# Patient Record
Sex: Male | Born: 1970 | Race: White | Hispanic: No | Smoking: Former smoker
Health system: Southern US, Community
[De-identification: ages and names within clinical notes are randomized; demographics above are authoritative.]

## PROBLEM LIST (undated history)

## (undated) DIAGNOSIS — F32A Depression, unspecified: Secondary | ICD-10-CM

## (undated) DIAGNOSIS — K8020 Calculus of gallbladder without cholecystitis without obstruction: Secondary | ICD-10-CM

## (undated) DIAGNOSIS — D72819 Decreased white blood cell count, unspecified: Secondary | ICD-10-CM

## (undated) DIAGNOSIS — E785 Hyperlipidemia, unspecified: Secondary | ICD-10-CM

## (undated) DIAGNOSIS — R079 Chest pain, unspecified: Secondary | ICD-10-CM

## (undated) DIAGNOSIS — F909 Attention-deficit hyperactivity disorder, unspecified type: Secondary | ICD-10-CM

## (undated) DIAGNOSIS — F329 Major depressive disorder, single episode, unspecified: Secondary | ICD-10-CM

## (undated) DIAGNOSIS — U071 COVID-19: Secondary | ICD-10-CM

## (undated) DIAGNOSIS — E786 Lipoprotein deficiency: Secondary | ICD-10-CM

## (undated) DIAGNOSIS — M109 Gout, unspecified: Secondary | ICD-10-CM

## (undated) DIAGNOSIS — I10 Essential (primary) hypertension: Secondary | ICD-10-CM

## (undated) HISTORY — DX: Hyperlipidemia, unspecified: E78.5

## (undated) HISTORY — DX: Decreased white blood cell count, unspecified: D72.819

## (undated) HISTORY — DX: Lipoprotein deficiency: E78.6

## (undated) HISTORY — DX: Chest pain, unspecified: R07.9

## (undated) HISTORY — DX: Essential (primary) hypertension: I10

## (undated) HISTORY — DX: Gout, unspecified: M10.9

---
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
## 2003-11-14 ENCOUNTER — Encounter: Admission: RE | Admit: 2003-11-14 | Discharge: 2003-11-14 | Payer: Self-pay | Admitting: Occupational Medicine

## 2004-05-30 ENCOUNTER — Observation Stay (HOSPITAL_COMMUNITY): Admission: EM | Admit: 2004-05-30 | Discharge: 2004-05-31 | Payer: Self-pay | Admitting: Emergency Medicine

## 2008-08-03 ENCOUNTER — Emergency Department (HOSPITAL_COMMUNITY): Admission: EM | Admit: 2008-08-03 | Discharge: 2008-08-03 | Payer: Self-pay | Admitting: Emergency Medicine

## 2008-09-10 ENCOUNTER — Emergency Department (HOSPITAL_BASED_OUTPATIENT_CLINIC_OR_DEPARTMENT_OTHER): Admission: EM | Admit: 2008-09-10 | Discharge: 2008-09-11 | Payer: Self-pay | Admitting: Emergency Medicine

## 2008-09-10 ENCOUNTER — Ambulatory Visit: Payer: Self-pay | Admitting: Diagnostic Radiology

## 2008-09-19 ENCOUNTER — Encounter: Admission: RE | Admit: 2008-09-19 | Discharge: 2008-09-19 | Payer: Self-pay | Admitting: Family Medicine

## 2008-10-12 DIAGNOSIS — I10 Essential (primary) hypertension: Secondary | ICD-10-CM | POA: Insufficient documentation

## 2008-10-12 DIAGNOSIS — K219 Gastro-esophageal reflux disease without esophagitis: Secondary | ICD-10-CM | POA: Insufficient documentation

## 2008-10-12 DIAGNOSIS — F329 Major depressive disorder, single episode, unspecified: Secondary | ICD-10-CM

## 2008-10-12 DIAGNOSIS — F3289 Other specified depressive episodes: Secondary | ICD-10-CM | POA: Insufficient documentation

## 2008-10-13 ENCOUNTER — Ambulatory Visit: Payer: Self-pay | Admitting: Internal Medicine

## 2008-10-13 DIAGNOSIS — R509 Fever, unspecified: Secondary | ICD-10-CM

## 2008-10-13 DIAGNOSIS — R05 Cough: Secondary | ICD-10-CM

## 2008-10-13 DIAGNOSIS — J309 Allergic rhinitis, unspecified: Secondary | ICD-10-CM | POA: Insufficient documentation

## 2008-10-13 DIAGNOSIS — R079 Chest pain, unspecified: Secondary | ICD-10-CM

## 2008-10-21 ENCOUNTER — Encounter: Payer: Self-pay | Admitting: Internal Medicine

## 2008-10-25 ENCOUNTER — Ambulatory Visit: Payer: Self-pay | Admitting: Internal Medicine

## 2008-10-25 ENCOUNTER — Telehealth (INDEPENDENT_AMBULATORY_CARE_PROVIDER_SITE_OTHER): Payer: Self-pay | Admitting: *Deleted

## 2008-11-02 ENCOUNTER — Telehealth: Payer: Self-pay | Admitting: Internal Medicine

## 2008-11-07 ENCOUNTER — Telehealth: Payer: Self-pay | Admitting: Internal Medicine

## 2008-11-11 ENCOUNTER — Ambulatory Visit (HOSPITAL_COMMUNITY): Admission: RE | Admit: 2008-11-11 | Discharge: 2008-11-11 | Payer: Self-pay | Admitting: Internal Medicine

## 2008-11-11 ENCOUNTER — Encounter: Payer: Self-pay | Admitting: Internal Medicine

## 2008-11-14 ENCOUNTER — Inpatient Hospital Stay (HOSPITAL_BASED_OUTPATIENT_CLINIC_OR_DEPARTMENT_OTHER): Admission: RE | Admit: 2008-11-14 | Discharge: 2008-11-14 | Payer: Self-pay | Admitting: Interventional Cardiology

## 2008-11-14 ENCOUNTER — Encounter: Payer: Self-pay | Admitting: Internal Medicine

## 2008-11-14 HISTORY — PX: CARDIAC CATHETERIZATION: SHX172

## 2008-11-15 ENCOUNTER — Telehealth: Payer: Self-pay | Admitting: Internal Medicine

## 2008-11-18 ENCOUNTER — Telehealth: Payer: Self-pay | Admitting: Internal Medicine

## 2008-11-22 ENCOUNTER — Telehealth: Payer: Self-pay | Admitting: Internal Medicine

## 2008-11-22 ENCOUNTER — Ambulatory Visit: Admission: RE | Admit: 2008-11-22 | Discharge: 2008-11-22 | Payer: Self-pay | Admitting: Internal Medicine

## 2008-11-22 ENCOUNTER — Ambulatory Visit: Payer: Self-pay | Admitting: Internal Medicine

## 2008-11-22 DIAGNOSIS — J383 Other diseases of vocal cords: Secondary | ICD-10-CM

## 2008-11-22 HISTORY — PX: LARYNGOSCOPY: SHX5203

## 2008-11-23 ENCOUNTER — Telehealth: Payer: Self-pay | Admitting: Internal Medicine

## 2008-11-23 ENCOUNTER — Encounter: Payer: Self-pay | Admitting: Internal Medicine

## 2008-12-05 ENCOUNTER — Encounter: Admission: RE | Admit: 2008-12-05 | Discharge: 2008-12-05 | Payer: Self-pay | Admitting: Family Medicine

## 2008-12-07 ENCOUNTER — Ambulatory Visit: Payer: Self-pay | Admitting: Hematology & Oncology

## 2009-01-19 ENCOUNTER — Ambulatory Visit: Payer: Self-pay | Admitting: Hematology & Oncology

## 2009-01-23 LAB — CBC WITH DIFFERENTIAL (CANCER CENTER ONLY)
BASO%: 0.4 % (ref 0.0–2.0)
EOS%: 1.9 % (ref 0.0–7.0)
HGB: 12.9 g/dL — ABNORMAL LOW (ref 13.0–17.1)
LYMPH#: 1 10*3/uL (ref 0.9–3.3)
MCHC: 33.4 g/dL (ref 32.0–35.9)
NEUT#: 2.4 10*3/uL (ref 1.5–6.5)
Platelets: 114 10*3/uL — ABNORMAL LOW (ref 145–400)
RDW: 15.6 % — ABNORMAL HIGH (ref 10.5–14.6)

## 2009-01-25 LAB — HAPTOGLOBIN: Haptoglobin: 138 mg/dL (ref 16–200)

## 2009-01-25 LAB — RETICULOCYTES (CHCC)
ABS Retic: 68.8 10*3/uL (ref 19.0–186.0)
RBC.: 6.25 MIL/uL — ABNORMAL HIGH (ref 4.22–5.81)

## 2009-01-25 LAB — IRON AND TIBC
%SAT: 17 % — ABNORMAL LOW (ref 20–55)
TIBC: 328 ug/dL (ref 215–435)

## 2009-01-25 LAB — HEMOGLOBINOPATHY EVALUATION: Hgb A2 Quant: 5.2 % — ABNORMAL HIGH (ref 2.2–3.2)

## 2009-04-14 ENCOUNTER — Ambulatory Visit: Payer: Self-pay | Admitting: Hematology & Oncology

## 2009-10-29 ENCOUNTER — Emergency Department (HOSPITAL_COMMUNITY): Admission: EM | Admit: 2009-10-29 | Discharge: 2009-10-29 | Payer: Self-pay | Admitting: Family Medicine

## 2010-02-11 ENCOUNTER — Emergency Department (HOSPITAL_COMMUNITY)
Admission: EM | Admit: 2010-02-11 | Discharge: 2010-02-11 | Payer: Self-pay | Source: Home / Self Care | Admitting: Family Medicine

## 2010-02-11 ENCOUNTER — Emergency Department (HOSPITAL_COMMUNITY)
Admission: EM | Admit: 2010-02-11 | Discharge: 2010-02-11 | Payer: Self-pay | Source: Home / Self Care | Admitting: Emergency Medicine

## 2010-04-10 ENCOUNTER — Ambulatory Visit (HOSPITAL_COMMUNITY): Admit: 2010-04-10 | Payer: Self-pay | Admitting: Psychology

## 2010-06-05 LAB — CBC
MCH: 20.1 pg — ABNORMAL LOW (ref 26.0–34.0)
MCHC: 32.3 g/dL (ref 30.0–36.0)
MCV: 62.3 fL — ABNORMAL LOW (ref 78.0–100.0)
Platelets: 119 10*3/uL — ABNORMAL LOW (ref 150–400)
RBC: 6.95 MIL/uL — ABNORMAL HIGH (ref 4.22–5.81)

## 2010-06-05 LAB — DIFFERENTIAL
Eosinophils Absolute: 0 10*3/uL (ref 0.0–0.7)
Eosinophils Relative: 0 % (ref 0–5)
Lymphs Abs: 1.6 10*3/uL (ref 0.7–4.0)

## 2010-06-05 LAB — POCT I-STAT, CHEM 8
Creatinine, Ser: 1.1 mg/dL (ref 0.4–1.5)
Glucose, Bld: 104 mg/dL — ABNORMAL HIGH (ref 70–99)
Hemoglobin: 15.6 g/dL (ref 13.0–17.0)
Sodium: 138 mEq/L (ref 135–145)
TCO2: 29 mmol/L (ref 0–100)

## 2010-06-08 LAB — POCT I-STAT, CHEM 8
Calcium, Ion: 1.19 mmol/L (ref 1.12–1.32)
Glucose, Bld: 122 mg/dL — ABNORMAL HIGH (ref 70–99)
HCT: 45 % (ref 39.0–52.0)
Hemoglobin: 15.3 g/dL (ref 13.0–17.0)

## 2010-07-03 LAB — BASIC METABOLIC PANEL
BUN: 15 mg/dL (ref 6–23)
Creatinine, Ser: 0.88 mg/dL (ref 0.4–1.5)
GFR calc non Af Amer: >60 mL/min (ref >60)

## 2010-07-03 LAB — DIFFERENTIAL
Eosinophils Relative: 0 % (ref 0–5)
Monocytes Absolute: 0.4 10*3/uL (ref 0.1–1.0)
Monocytes Relative: 6 % (ref 3–12)
Neutrophils Relative %: 90 % — ABNORMAL HIGH (ref 43–77)

## 2010-07-03 LAB — CBC
Platelets: 99 10*3/uL — ABNORMAL LOW (ref 150–400)
WBC: 7.3 10*3/uL (ref 4.0–10.5)

## 2010-07-03 LAB — CULTURE, BLOOD (ROUTINE X 2): Culture: NO GROWTH

## 2010-08-07 NOTE — Op Note (Signed)
NAME:  Cody Medina, Cody Medina          ACCOUNT NO.:  [id number]   MEDICAL RECORD NO.:  [id number]          PATIENT TYPE:  AMB   LOCATION:  CARD                         FACILITY:  [facility]   PHYSICIAN:  Kalman Shan, MD   DATE OF BIRTH:  February 04, 1971   DATE OF PROCEDURE:  11/22/2008  DATE OF DISCHARGE:                               OPERATIVE REPORT   TYPE OF PROCEDURE:  Laryngoscopy.   INDICATIONS:  Rule out vocal cord dysfunction. Dyspnea and Vocal Cord  Diseases NOS   PREPROCEDURE ASSESSMENT:  Cody Medina is a 40 year old male who some  months ago had a dental abscess removed, and since then have a lot of  respiratory symptoms and chest pain that worsened with exertion,  extensive cardiac workup was negative.  He presented for a methacholine  challenge test, and during that time on spirometry was found to have  variable inferior loop flattening suggestive of vocal cord dysfunction.  Therefore, this procedure is being performed to rule out vocal cord  dysfunction, any anatomic abnormalities in his upper airway.   PREPROCEDURE PLAN:  The plan was to do a laryngoscopy using a flexible  bronchoscope inserted through his nose to get a visualization of the  epiglottis and the vocal cords.  The plan was to do a resting  laryngoscopy and also one after exercise with a treadmill on a ramp.  This is because of his symptoms that are predominantly brought on when  he climbs stairs.   Preprocedure vital signs were stable.  Exam was essentially normal.  The  patient was observed to walked into the procedure suite, but after  coming here he started getting anxious and started complaining of foot  pain.   During the preprocedure preparation to insert lidocaine jelly into his  nose, he got extremely short of breath and all his symptoms were  reduced, and he was hyperventilating with anxiety.  All the rest of the  vital signs were stable.   PROCEDURE NOTE:  Based on the above, it was decided to  do a laryngoscopy  just at rest.  A small bronchoscope was introduced into his right naris.  Good view of the epiglottis and the arytenoids in the vocal cords were  obtained.  The arytenoids appeared a little bit generous in size, but no  obvious ulcerations or redness were noted either on the false vocal  cords, the arytenoids or the true vocal cords.  The epiglottis also  looked normal.  No obvious mass was seen.  He patient became extremely  symptomatic at rest even with the bronchoscope being introduced through  his right naris.  There is obvious paradoxical vocal cord dysfunction  present.  With inspiration, his vocal cords were adducting and the chink  was observed.  This was observed for a few minutes, and when I told him  to relax, his vocal cord movements tended to normalize but not fully.  After the procedure was terminated, the bronchoscope was withdrawn.   No sedation was given.   IMPRESSION:  Positive vocal cord dysfunction test.  He has PARADOXICAL VOCAL CORD DYSFUNCTION   POSTPROCEDURE PLAN:  1. He  can leave the procedure suite.  No recovery time is needed.  2. Will refer him to speech therapy  for vocal cord dysfunction.  His      mom and he were updated in full about his condition.      Kalman Shan, MD  Electronically Signed     MR/MEDQ  D:  11/22/2008  T:  11/22/2008  Job:  [id number]

## 2010-08-07 NOTE — Cardiovascular Report (Signed)
NAME:  Cody Medina, Cody Medina NO.:  [id number]   MEDICAL RECORD NO.:  [id number]          PATIENT TYPE:  OIB   LOCATION:  1966                         FACILITY:  [facility]   PHYSICIAN:  Corky Crafts, MDDATE OF BIRTH:  Jul 16, 1970   DATE OF PROCEDURE:  11/14/2008  DATE OF DISCHARGE:  11/14/2008                            CARDIAC CATHETERIZATION   REFERRING PHYSICIAN:  Kalman Shan, MD and also Emeterio Reeve, MD   PROCEDURES PERFORMED:  Left heart catheterization, left ventriculogram,  coronary angiogram, abdominal aortogram.   OPERATOR:  Corky Crafts, MD   INDICATIONS:  Persistent chest pain.   PROCEDURE NOTE:  The risks and benefits of cardiac catheterization were  explained to the patient and informed consent was obtained.  He was  brought to the cath lab.  He was prepped and draped in the usual sterile  fashion.  His right groin was infiltrated with 1% lidocaine.  A 4-French  sheath was placed into the right femoral artery using the modified  Seldinger technique.  Left coronary artery angiography was performed  using a JL-4.0 catheter.  The catheter was advanced to the vessel ostium  under fluoroscopic guidance.  Digital angiography was performed in  multiple projections using hand injection of contrast.  Right coronary  artery angiography was then performed using a 3DRC catheter.  The  catheter was advanced to the vessel ostium under fluoroscopic guidance.  Digital angiography was performed in multiple projections using hand  injection of contrast.  A pigtail catheter was advanced to the ascending  aorta and across the aortic valve.  Under fluoroscopic guidance, power  injection of contrast was performed in the RAO projection to image the  left ventricle.  Catheter was pulled back under continuous hemodynamic  pressure monitoring.  Catheter was withdrawn to the abdominal aorta.  Power injection of contrast was performed in the AP projection to  image  the infrarenal abdominal aorta.   FINDINGS:  The left main coronary artery is widely patent.  The left circumflex is a medium-sized vessel.  The first and second  obtuse marginals were small vessels and widely patent.  Left anterior descending is a medium-sized vessel, which reaches the  apex.  The first diagonal is medium-sized and also widely patent.  The right coronary artery is a large dominant vessel.  The posterior  descending artery and posterolateral artery are widely patent.   HEMODYNAMICS:  LV pressure 110/7 with an LVEDP of 16 mmHg.  Aortic  pressure 110/77 with a mean aortic pressure of 94 mmHg.  Left ventriculogram showed normal ventricular function with an estimated  ejection fraction of 55%.  Abdominal aortogram, no abdominal aortic aneurysm.  Bilateral single  renal arteries both of which are widely patent.   IMPRESSION:  1. Angiographically normal coronary arteries.  2. Normal left ventricular function.  3. Normal hemodynamics.  4. No renal artery stenosis or abdominal aortic aneurysm.   RECOMMENDATIONS:  It appears that the patient's pain is likely  noncardiac in origin.  He will again follow up with his pulmonologist.  He has mentioned that he will be having some type of bronchoscopy study  done.  I will follow up with him in the office just to check his groin  later this week.  Continue aggressive preventive therapy.      Corky Crafts, MD  Electronically Signed     JSV/MEDQ  D:  11/16/2008  T:  11/17/2008  Job:  [id number]

## 2010-08-10 NOTE — Discharge Summary (Signed)
NAME:  Cody Medina, Cody Medina          ACCOUNT NO.:  [id number]   MEDICAL RECORD NO.:  [id number]          PATIENT TYPE:  OBV   LOCATION:  3704                         FACILITY:  [facility]   PHYSICIAN:  Corinna L. Lendell Caprice, MDDATE OF BIRTH:  05-19-1970   DATE OF ADMISSION:  05/30/2004  DATE OF DISCHARGE:                                 DISCHARGE SUMMARY   DIAGNOSIS:  Chest pain most likely secondary to gastroesophageal reflux  disease, Cardiolite negative.   DISCHARGE MEDICATION:  Protonix 40 mg a day.   Follow up with Dr. Manus Gunning in two to four weeks.   ACTIVITY:  Ad lib.   CONDITION:  Stable.   CONSULTATIONS:  Dr. Patty Sermons.   PROCEDURES:  None.   PERTINENT LABORATORY DATA:  D-dimer less than 0.22.  Hemoglobin/hematocrit,  BMET, liver function tests, lipase, serial cardiac enzymes all within normal  limits. Total cholesterol was 170, LDL was 95, HDL 37, triglycerides 189.   SPECIAL STUDIES AND RADIOLOGY:  Chest x-ray was negative.   EKG showed normal sinus rhythm.   Stress Cardiolite this showed no ischemia or infarct, ejection fraction 64%.   HISTORY AND HOSPITAL COURSE:  [name] is a 40 year old white male  patient of Dr. Manus Gunning who presented with chest pressure which radiated to  his left shoulder.  It was not associated with any exertion.  He did  somewhat relieve with belching.  He had had it periodically for several  days. Cardiology was consulted. The patient  was placed on observation to telemetry, ruled out for myocardial infarction  and had a negative Cardiolite.  I suspect this may be GI in origin,  specifically gastroesophageal reflux and/or esophageal spasm. The patient  had been started on aspirin empirically and had normal vital signs and  normal exam. No reproducibility to the pain.      CLS/MEDQ  D:  05/31/2004  T:  05/31/2004  Job:  [id number]   cc:   Elmore Guise., M.D.  [address]  [place], [place] [address]  Fax: [phone]   Bryan Lemma. Manus Gunning, M.D.  [address] [place]  [place] [address]  Fax: [phone]

## 2010-08-10 NOTE — H&P (Signed)
NAME:  Cody Medina, IDE NO.:  [id number]   MEDICAL RECORD NO.:  [id number]          PATIENT TYPE:  EMS   LOCATION:  MAJO                         FACILITY:  [facility]   PHYSICIAN:  Hollice Espy, M.D.DATE OF BIRTH:  [birth year]   DATE OF ADMISSION:  05/30/2004  DATE OF DISCHARGE:                                HISTORY & PHYSICAL   PHYSICIANS:  1.  Corinna L. Lendell Caprice, M.D., attending physician.  2.  Bryan Lemma. Manus Gunning, M.D., primary care physician.   CHIEF COMPLAINT:  Chest pressure.   HISTORY OF PRESENT ILLNESS:  The patient is a 40 year old white male with a  past medical history of hypertension who presents after an episode of heavy  chest pressure. The patient tells me he has been in his relatively well  state of health except over the last few weeks. He has noticed episodes of  chest versus arm pain/pressure where it will radiate down his arm or across  the left side of his chest, lasting generally for a few moments, usually  with exertion causing shortness of breath and usually while the patient is  doing something active. He did not make to much of this until today when he  had some episode all day of this continuous, severe, crushing chest pressure  involving the left side of his chest mostly, but also radiating to the right  side of his chest and down his arm. It caused significant shortness of  breath and the patient became quite concern and came into the emergency  room.   In the emergency room, he was noted to be saturating 100% on two liters.  Chest x-ray was done and found to be negative. Lab work for the most part  was negative and negative d-dimer. Normal LFTs, electrolytes, and a first  set of cardiac markers were negative. An EKG was performed which the  computer read as normal sinus rhythm; however, there is some very minimal  early ST appearing elevations on his inferior leads. The patient initially  prior to coming to the emergency room went  to his primary care physician,  Dr. Bryan Lemma. Ehinger's office, and there he had an EKG done as well. There  some Q-waves were noted in his inferior leads. The patient was brought to  the emergency room, given aspirin, nitroglycerin, and oxygen. He said that  the chest pressure relieved with the nitroglycerin and he is feeling now  back to completely normal.   REVIEW OF SYMPTOMS:  He has no other complaints. He currently denies any  headaches, visual changes, dysphagia, chest pain, palpitation, shortness of  breath, wheeze, cough, abdominal pain, constipation, diarrhea, hematuria,  dysuria, or focal extremity weakness or numbness. His review of systems is  otherwise negative.   PAST MEDICAL HISTORY:  Only for seasonal rhinitis and hypertension.   MEDICATIONS:  Takes an occasional Allegra. He does not take any medications  for his hypertension as he has been told that he is too young.   ALLERGIES:  He is allergic to MORPHINE.   SOCIAL HISTORY:  He smokes an occasional cigar and drinks one or two beers  maximum on a weekend. He denies any drug use.   FAMILY HISTORY:  Notable for a grandfather with a MI in his 49s.   PHYSICAL EXAMINATION:  VITAL SIGNS:  Temperature 98.2, heart rate 88,  respirations 28, blood pressure 116/78. Oxygen saturation 100% on two  liters.  GENERAL:  The patient appears alert and oriented x 3. He is large for his  age.  HEENT:  Normocephalic and atraumatic. His mucus membranes are moist. He has  a mildly narrow airway.  HEART:  Regular rate and rhythm, S1 and S2.  LUNGS:  Clear to auscultation bilaterally.  ABDOMEN:  Soft, nontender, and nondistended. Positive bowel sounds.  EXTREMITIES:  No clubbing, cyanosis, or edema.   LABORATORY DATA:  Chest x-ray is normal. D-dimer is less than 0.22.  Creatinine of 1. LFTs normal. Lipase 22, sodium 138, potassium 3.8, chloride  106, bicarbonate 29, BUN 16, glucose 116. A pH of 7.54, pCO2 29. H&H 16.3  and 48. CPK  56.6, MB 2.8, troponin I less than 0.05.   ASSESSMENT/PLAN:  1.  Chest pain:  Although the patient is young at 33, he does have some risk      factors with his size, history of hypertension. He has never had his      cholesterol checked and his electrocardiogram shows some questionable      findings in the inferior leads. We will admit, rule out for a myocardial      infarction, check two more sets of serial enzymes, recheck      electrocardiogram in the morning. Check fasting lipid profile in the      morning and then get a stress test. We will ask [facility] for      assistance.  2.  Hypertension:  The patient's blood pressure here appears to be okay,      although reports that he has a diastolic of at least 100 at home and      this may need medication therapy if it continues to be a problem.  3.  Allergic rhinitis:  Stable.  4.  Occasional tobacco use.      SKK/MEDQ  D:  05/30/2004  T:  05/30/2004  Job:  [id number]   cc:   Bryan Lemma. Manus Gunning, M.D.  [address] [place]  [place] [address]  Fax: [phone]   [facility]

## 2010-08-28 ENCOUNTER — Other Ambulatory Visit: Payer: Self-pay | Admitting: Family Medicine

## 2010-08-28 ENCOUNTER — Ambulatory Visit
Admission: RE | Admit: 2010-08-28 | Discharge: 2010-08-28 | Disposition: A | Discharge status: Home / Self Care | Payer: BC Managed Care – PPO | Source: Ambulatory Visit | Attending: Family Medicine | Admitting: Family Medicine

## 2010-08-28 DIAGNOSIS — M25569 Pain in unspecified knee: Secondary | ICD-10-CM

## 2010-09-10 ENCOUNTER — Ambulatory Visit: Payer: BC Managed Care – PPO | Attending: Family Medicine

## 2010-09-10 DIAGNOSIS — R269 Unspecified abnormalities of gait and mobility: Secondary | ICD-10-CM | POA: Insufficient documentation

## 2010-09-10 DIAGNOSIS — R262 Difficulty in walking, not elsewhere classified: Secondary | ICD-10-CM | POA: Insufficient documentation

## 2010-09-10 DIAGNOSIS — M25569 Pain in unspecified knee: Secondary | ICD-10-CM | POA: Insufficient documentation

## 2010-09-10 DIAGNOSIS — M25669 Stiffness of unspecified knee, not elsewhere classified: Secondary | ICD-10-CM | POA: Insufficient documentation

## 2010-09-10 DIAGNOSIS — IMO0001 Reserved for inherently not codable concepts without codable children: Secondary | ICD-10-CM | POA: Insufficient documentation

## 2010-09-11 ENCOUNTER — Ambulatory Visit: Payer: BC Managed Care – PPO

## 2010-10-01 ENCOUNTER — Encounter: Payer: BC Managed Care – PPO | Admitting: Physical Therapy

## 2012-03-04 ENCOUNTER — Telehealth: Payer: Self-pay | Admitting: Hematology & Oncology

## 2012-03-04 NOTE — Telephone Encounter (Signed)
Left pt message to call for appointments °

## 2012-03-05 ENCOUNTER — Telehealth: Payer: Self-pay | Admitting: Hematology & Oncology

## 2012-03-05 NOTE — Telephone Encounter (Signed)
Left message on both numbers for pt to call and schedule appointment

## 2012-03-06 ENCOUNTER — Telehealth: Payer: Self-pay | Admitting: Hematology & Oncology

## 2012-03-06 NOTE — Telephone Encounter (Signed)
PT AWARE OF 03-11-12 APPOINTMENT

## 2012-03-11 ENCOUNTER — Ambulatory Visit: Payer: BC Managed Care – PPO

## 2012-03-11 ENCOUNTER — Other Ambulatory Visit (HOSPITAL_BASED_OUTPATIENT_CLINIC_OR_DEPARTMENT_OTHER): Payer: BC Managed Care – PPO | Admitting: Lab

## 2012-03-11 ENCOUNTER — Ambulatory Visit (HOSPITAL_BASED_OUTPATIENT_CLINIC_OR_DEPARTMENT_OTHER): Payer: BC Managed Care – PPO | Admitting: Hematology & Oncology

## 2012-03-11 DIAGNOSIS — D696 Thrombocytopenia, unspecified: Secondary | ICD-10-CM

## 2012-03-11 DIAGNOSIS — R634 Abnormal weight loss: Secondary | ICD-10-CM

## 2012-03-11 DIAGNOSIS — R161 Splenomegaly, not elsewhere classified: Secondary | ICD-10-CM

## 2012-03-11 DIAGNOSIS — D649 Anemia, unspecified: Secondary | ICD-10-CM

## 2012-03-11 LAB — CBC WITH DIFFERENTIAL (CANCER CENTER ONLY)
BASO#: 0 10*3/uL (ref 0.0–0.2)
Eosinophils Absolute: 0.1 10*3/uL (ref 0.0–0.5)
HCT: 41.6 % (ref 38.7–49.9)
HGB: 13.6 g/dL (ref 13.0–17.1)
LYMPH%: 20.8 % (ref 14.0–48.0)
MCH: 20 pg — ABNORMAL LOW (ref 28.0–33.4)
MCV: 61 fL — ABNORMAL LOW (ref 82–98)
MONO#: 0.4 10*3/uL (ref 0.1–0.9)
MONO%: 10 % (ref 0.0–13.0)
RBC: 6.81 10*6/uL — ABNORMAL HIGH (ref 4.20–5.70)
WBC: 4.3 10*3/uL (ref 4.0–10.0)

## 2012-03-11 NOTE — Progress Notes (Signed)
This office note has been dictated.

## 2012-03-12 NOTE — Progress Notes (Signed)
CC:   Cody Reeve, MD  DIAGNOSES: 1. Thrombocytopenia-mild. 2. Unintentional weight loss.  HISTORY OF PRESENT ILLNESS:  Cody Medina is a very nice 41 year old white gentleman.  He is originally from [place].  He went into the [employer] after high school.  He was out in [place].  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  He, I think, works as a Clinical biochemist.  He is followed by Dr. Paulino Rily.  He has noted weight loss that is unintentional.  He has lost about 40 or 45 pounds over the past few months.  He has not had any history of fevers or sweats.  He has had no change in bowel or bladder habits.  There has been no bleeding.  His appetite is okay.  He has not noticed any palpable lymph glands.  Dr. Paulino Rily has done lab work on him.  She found that he did have some mild thrombocytopenia.  Lab work that was done back in early November showed platelet count of 119.  He was not anemic with a hemoglobin of 13.1.  White cell count was 42.  He does have a very low MCV.  He does state that his mother had "Mediterranean anemia."  In early December, a CBC was repeated.  This showed a white cell count of 4.6, hemoglobin 13.5, hematocrit 41.8, platelet count 125.  MCV was 62.  She did a TSH.  This was normal.  She did do a Hemoccult.  This apparently was positive.  It was felt that this may represent hemorrhoids.  His metabolic panel showed normal liver function studies.  His sodium was normal.  BUN and creatinine were also okay.  He had a PSA done which was 0.55.  Again, TSH was normal at 0.66.  He was found to have some external and thrombosed hemorrhoids.  His stool was brown.  Again, stool was heme positive.  There is a family history of, I think, colon cancer in a great aunt. Outside of that, no other family history of cancer is noted.  He has had no cough.  He has had no rashes.  He has had no dysphagia or odynophagia.  There is no double vision or  blurred vision.  He has had no headache.  Dr. Paulino Rily kindly referred Cody Medina to the [facility] for an evaluation.  Overall, his performance status is ECOG 0.  He has no obvious risk factors for hepatitis or HIV.  He currently is going through a separation with his wife.  PAST MEDICAL HISTORY: 1. Remarkable for adult onset ADHD. 2. Gout. 3. Thalassemia minor.  ALLERGIES: 1. Morphine. 2. Zantac. 3. Lamictal. 4.  MEDICATIONS:  Adderall 10 mg p.o. b.i.d., Klonopin 0.5 mg p.o. b.i.d., Lexapro I think 20 mg p.o. daily, Colcrys 0.6 mg p.o. daily p.r.n.  SOCIAL HISTORY:  Negative for any current tobacco use.  He did smoke in the past.  He has no significant alcohol use.  There is no recreational drug use.  He has no occupational exposures.  FAMILY HISTORY:  As stated previously.  REVIEW OF SYSTEMS:  As stated in the history of present illness.  No additional findings are noted on a 12-system review.  PHYSICAL EXAMINATION:  General:  This is a well-developed, well- nourished white gentleman in no obvious distress.  Vital signs: Temperature of 98, pulse is 72, respiratory rate 16, blood pressure 136/96.  Weight is 220.  Head and neck:  Normocephalic, atraumatic skull.  There are no ocular or oral lesions.  There are no palpable cervical or supraclavicular lymph nodes.  Lungs:  Clear to percussion and auscultation bilaterally.  Cardiac:  Regular rate and rhythm with a normal S1 and S2.  There are no murmurs, rubs, or bruits.  Axillary:  No bilateral axillary adenopathy.  Abdomen:  Soft.  He has good bowel sounds.  There is no fluid wave.  There is no palpable hepatomegaly. His spleen tip is palpable with deep inspiration under the left costal margin.  Back:  No tenderness over the spine, ribs, or hips. Extremities:  No clubbing, cyanosis, or edema.  Neurological:  No focal neurological deficits.  Skin:  No rashes, ecchymoses, or  petechiae.  LABORATORY STUDIES:  White cell count is 4.3, hemoglobin 13.6, hematocrit 41.6, and platelet count is 124.  MCV is 61.  Peripheral smear shows microcytic red cells.  He has a few target cells. No schistocytes are noted.  There are no nucleated red blood cells.  I see no rouleaux formation.  There are no spherocytes.  White cells appear normal in morphology and maturation.  No hypersegmented polys are noted.  There are no immature myeloid cells.  There are no atypical lymphocytes.  Platelets are mildly decreased in number.  He has numerous large platelets.  Platelets are well granulated.  IMPRESSION:  Cody Medina is a really nice 41 year old gentleman with weight loss.  He has some thrombocytopenia which is mild.  There is splenomegaly.  I suppose this may be related to his thalassemia.  I am pretty sure he probably does have some thalassemia, given his low mean cell volume and not being anemic.  I did note that he does have some heme-positive stools.  These are felt to be reflective of hemorrhoids, however.  I believe that Cody Medina clearly needs to have a CT scan done.  I think we do need to check his chest, abdomen, and pelvis.  The fact that he has the splenomegaly might indicate an underlying hematologic issue.  Again, it is hard to say what the cause of this weight loss is. Splenomegaly certainly would be the source of his mild thrombocytopenia. The thrombocytopenia is asymptomatic.  I do not believe that the thrombocytopenia is related to his weight loss.  We will get Cody Medina set up with a CT scan within the week.  I think that further therapeutic interventions will be dictated by the results of his CT scan.  I am sending off an HIV titer on him.  Again, he has no obvious risk factors, but I still think that this would be obligatory.  I am sending off a hemoglobin electrophoresis on him also.  Hopefully, we will not find an underlying  malignancy.  I do think that if his scans are negative, that he probably still would need to have a colonoscopy.  I am checking iron studies on him.  I think if the iron studies do come back consistent with iron deficiency, despite him not being anemic, then this would obligate US to get him to a colonoscopy.  I spent a good our or more with Cody Medina.  He is a real nice guy. He served in [employer] for a year or so before knee problems got him medically discharged.  Again, we can probably care of a lot of this over the phone right now.    ______________________________ Josph Macho, M.D. PRE/MEDQ  D:  03/11/2012  T:  03/12/2012  Job:  [id number]

## 2012-03-13 LAB — COMPREHENSIVE METABOLIC PANEL
ALT: 24 U/L (ref 0–53)
AST: 13 U/L (ref 0–37)
Creatinine, Ser: 0.86 mg/dL (ref 0.50–1.35)
Total Bilirubin: 0.7 mg/dL (ref 0.3–1.2)

## 2012-03-13 LAB — HEMOGLOBINOPATHY EVALUATION: Hemoglobin Other: 0 %

## 2012-03-13 LAB — IRON AND TIBC
%SAT: 19 % — ABNORMAL LOW (ref 20–55)
TIBC: 339 ug/dL (ref 215–435)

## 2012-03-13 LAB — RETICULOCYTES (CHCC)
ABS Retic: 97 10*3/uL (ref 19.0–186.0)
RBC.: 6.93 MIL/uL — ABNORMAL HIGH (ref 4.22–5.81)

## 2012-03-13 LAB — HIV ANTIBODY (ROUTINE TESTING W REFLEX): HIV: NONREACTIVE

## 2012-03-19 ENCOUNTER — Ambulatory Visit (HOSPITAL_BASED_OUTPATIENT_CLINIC_OR_DEPARTMENT_OTHER)
Admission: RE | Admit: 2012-03-19 | Discharge: 2012-03-19 | Disposition: A | Discharge status: Home / Self Care | Payer: BC Managed Care – PPO | Source: Ambulatory Visit | Attending: Hematology & Oncology | Admitting: Hematology & Oncology

## 2012-03-19 ENCOUNTER — Telehealth: Payer: Self-pay | Admitting: *Deleted

## 2012-03-19 DIAGNOSIS — R634 Abnormal weight loss: Secondary | ICD-10-CM

## 2012-03-19 DIAGNOSIS — D649 Anemia, unspecified: Secondary | ICD-10-CM

## 2012-03-19 DIAGNOSIS — R161 Splenomegaly, not elsewhere classified: Secondary | ICD-10-CM

## 2012-03-19 DIAGNOSIS — D61818 Other pancytopenia: Secondary | ICD-10-CM | POA: Insufficient documentation

## 2012-03-19 DIAGNOSIS — E041 Nontoxic single thyroid nodule: Secondary | ICD-10-CM | POA: Insufficient documentation

## 2012-03-19 MED ORDER — IOHEXOL 300 MG/ML  SOLN
100.0000 mL | Freq: Once | INTRAMUSCULAR | Status: AC | PRN
  Administered 2012-03-19: 100 mL via INTRAVENOUS

## 2012-03-19 NOTE — Telephone Encounter (Addendum)
Message copied by Mirian Capuchin on Thu Mar 19, 2012  3:40 PM ------      Message from: Arlan Organ R      Created: Thu Mar 19, 2012 11:16 AM       I called and told him that the CT scan showed mild splenomegaly, but nothing else.  Labs were c/w Thal trait.  I told him to take some folic acid - OTC.  I do not need to see him in the future unless there is a problem.  No need for a BM Bx.  Platelets will always be on the low side, but should not be low enough to cause issues.  Pete E Dr. Myna Hidalgo called pt himself.

## 2012-03-25 DIAGNOSIS — K8020 Calculus of gallbladder without cholecystitis without obstruction: Secondary | ICD-10-CM

## 2012-03-25 HISTORY — DX: Calculus of gallbladder without cholecystitis without obstruction: K80.20

## 2012-03-28 ENCOUNTER — Emergency Department (HOSPITAL_COMMUNITY)
Admission: EM | Admit: 2012-03-28 | Discharge: 2012-03-28 | Disposition: A | Discharge status: Home / Self Care | Payer: BC Managed Care – PPO | Attending: Emergency Medicine | Admitting: Emergency Medicine

## 2012-03-28 ENCOUNTER — Emergency Department (HOSPITAL_COMMUNITY): Payer: BC Managed Care – PPO

## 2012-03-28 ENCOUNTER — Encounter (HOSPITAL_COMMUNITY): Payer: Self-pay

## 2012-03-28 DIAGNOSIS — R1110 Vomiting, unspecified: Secondary | ICD-10-CM | POA: Insufficient documentation

## 2012-03-28 DIAGNOSIS — Z79899 Other long term (current) drug therapy: Secondary | ICD-10-CM | POA: Insufficient documentation

## 2012-03-28 DIAGNOSIS — F3289 Other specified depressive episodes: Secondary | ICD-10-CM | POA: Insufficient documentation

## 2012-03-28 DIAGNOSIS — F329 Major depressive disorder, single episode, unspecified: Secondary | ICD-10-CM | POA: Insufficient documentation

## 2012-03-28 DIAGNOSIS — K8020 Calculus of gallbladder without cholecystitis without obstruction: Secondary | ICD-10-CM | POA: Insufficient documentation

## 2012-03-28 HISTORY — DX: Major depressive disorder, single episode, unspecified: F32.9

## 2012-03-28 HISTORY — DX: Depression, unspecified: F32.A

## 2012-03-28 LAB — COMPREHENSIVE METABOLIC PANEL
AST: 15 U/L (ref 0–37)
Albumin: 4.4 g/dL (ref 3.5–5.2)
Alkaline Phosphatase: 73 U/L (ref 39–117)
BUN: 17 mg/dL (ref 6–23)
Chloride: 103 mEq/L (ref 96–112)
Potassium: 3.9 mEq/L (ref 3.5–5.1)
Total Bilirubin: 0.5 mg/dL (ref 0.3–1.2)

## 2012-03-28 LAB — CBC
HCT: 41.8 % (ref 39.0–52.0)
RDW: 14.8 % (ref 11.5–15.5)
WBC: 9.9 10*3/uL (ref 4.0–10.5)

## 2012-03-28 LAB — URINALYSIS, ROUTINE W REFLEX MICROSCOPIC
Bilirubin Urine: NEGATIVE
Glucose, UA: NEGATIVE mg/dL
Hgb urine dipstick: NEGATIVE
Ketones, ur: 15 mg/dL — AB
Protein, ur: NEGATIVE mg/dL

## 2012-03-28 MED ORDER — ONDANSETRON HCL 4 MG/2ML IJ SOLN
4.0000 mg | Freq: Once | INTRAMUSCULAR | Status: AC
  Administered 2012-03-28: 4 mg via INTRAVENOUS
  Filled 2012-03-28: qty 2

## 2012-03-28 MED ORDER — ONDANSETRON HCL 4 MG PO TABS: 4.0000 mg | ORAL_TABLET | Freq: Four times a day (QID) | ORAL | Status: DC

## 2012-03-28 MED ORDER — HYDROMORPHONE HCL PF 1 MG/ML IJ SOLN
1.0000 mg | INTRAMUSCULAR | Status: DC | PRN
  Administered 2012-03-28: 1 mg via INTRAVENOUS
  Filled 2012-03-28: qty 1

## 2012-03-28 MED ORDER — OXYCODONE-ACETAMINOPHEN 5-325 MG PO TABS: 2.0000 | ORAL_TABLET | ORAL | Status: DC | PRN

## 2012-03-28 NOTE — ED Notes (Signed)
Pt A.O. X 4. Complains of  Pain "under right rib cage that wraps around to right side." Tender in RUQ and right flank. 10/10 x 4 hours. Denies  Injury to abd. Denies any previous episode. Denies any significant medical hxt. Reports hxt of depression.  Pt constantly changing positions. Yelling and screaming "help me! Please! Give me something! Give me anything!". NSR. Vitals stable.

## 2012-03-28 NOTE — ED Notes (Signed)
PT returned from ultrasound

## 2012-03-28 NOTE — ED Notes (Signed)
Family updated on plan of care: aware that [place] results are pending and that EDP will come and discuss findings with patient. Pt resting comfortably. Vitals stable.  No further needs at this time.

## 2012-03-28 NOTE — ED Notes (Signed)
RUQ pain. N/V. 18G R AC. Given 4 Zofran by EMS. Allergic to Morphine. No medications transported with patient.

## 2012-03-28 NOTE — ED Notes (Signed)
Pt A.O.x  4. Vitals stable. Discharge instructions reviewed with patient and mother. Verbalized understanding of medication and need to follow up. No further questions at this time. Ambulatory with minimal assistance.

## 2012-03-28 NOTE — ED Provider Notes (Signed)
History     CSN: [id number]  Arrival date & time 03/28/12  0139   First MD Initiated Contact with Patient 03/28/12 0205      No chief complaint on file.   (Consider location/radiation/quality/duration/timing/severity/associated sxs/prior treatment) HPI 42 year old male presents to the emergency department with complaint of upper abdominal pain with vomiting. He reports pain started diffusely across his upper abdomen around 9 PM. Patient then had multiple episodes of vomiting. He now reports pain is worse over his right upper quadrant. He denies previous history of same. He does have a history of GERD. He does not take any medicines for it. Patient is agitated, writhing in the bed, and no further history is able to be obtained.  Past Medical History  Diagnosis Date  . Depression     History reviewed. No pertinent past surgical history.  History reviewed. No pertinent family history.  History  Substance Use Topics  . Smoking status: Not on file  . Smokeless tobacco: Not on file  . Alcohol Use:       Review of Systems  Unable to perform ROS pt unable to cooperate  Allergies  Morphine sulfate  Home Medications   Current Outpatient Rx  Name  Route  Sig  Dispense  Refill  . AMPHETAMINE-DEXTROAMPHETAMINE 10 MG PO TABS   Oral   Take 10 mg by mouth Daily.         Marland Kitchen CLONAZEPAM 1 MG PO TABS   Oral   Take 1 mg by mouth Daily.         Marland Kitchen ESCITALOPRAM OXALATE 20 MG PO TABS   Oral   Take 20 mg by mouth Daily.           BP 133/84  Pulse 78  Resp 20  SpO2 100%  Physical Exam  Nursing note and vitals reviewed. Constitutional: He is oriented to person, place, and time. He appears well-developed and well-nourished.  HENT:  Head: Normocephalic and atraumatic.  Nose: Nose normal.  Mouth/Throat: Oropharynx is clear and moist.  Eyes: Conjunctivae normal and EOM are normal. Pupils are equal, round, and reactive to light.  Neck: Normal range of motion. Neck supple.  No JVD present. No tracheal deviation present. No thyromegaly present.  Cardiovascular: Normal rate, regular rhythm, normal heart sounds and intact distal pulses.  Exam reveals no gallop and no friction rub.   No murmur heard. Pulmonary/Chest: Effort normal and breath sounds normal. No stridor. No respiratory distress. He has no wheezes. He has no rales. He exhibits no tenderness.  Abdominal: Soft. Bowel sounds are normal. He exhibits no distension and no mass. There is tenderness (Significant tenderness over right upper quadrant with positive Murphy sign). There is no rebound and no guarding.  Musculoskeletal: Normal range of motion. He exhibits no edema and no tenderness.  Lymphadenopathy:    He has no cervical adenopathy.  Neurological: He is alert and oriented to person, place, and time. He exhibits normal muscle tone. Coordination normal.  Skin: Skin is warm and dry. No rash noted. No erythema. No pallor.  Psychiatric: He has a normal mood and affect. His behavior is normal. Judgment and thought content normal.    ED Course  Procedures (including critical care time)  Labs Reviewed  CBC - Abnormal; Notable for the following:    RBC 6.74 (*)     MCV 62.0 (*)     MCH 19.6 (*)     Platelets 140 (*)     All other components within normal  limits  COMPREHENSIVE METABOLIC PANEL - Abnormal; Notable for the following:    Glucose, Bld 156 (*)     All other components within normal limits  URINALYSIS, ROUTINE W REFLEX MICROSCOPIC - Abnormal; Notable for the following:    APPearance HAZY (*)     Specific Gravity, Urine 1.031 (*)     Ketones, ur 15 (*)     All other components within normal limits  LIPASE, BLOOD   US Abdomen Complete  03/28/2012  *RADIOLOGY REPORT*  Clinical Data:  Severe upper abdominal pain mostly right upper quadrant, nausea, vomiting, past history of splenectomy  ULTRASOUND ABDOMEN:  Technique:  Sonography of upper abdominal structures was performed.  Comparison:  CT  abdomen 03/19/2012  Gallbladder:  Normally distended, containing sludge as well as a 2.4 cm diameter gallstone at the gallbladder neck.  Minimal gallbladder wall thickening.  No pericholecystic fluid.  Unable to accurately assess for presence of a sonographic Murphy's sign due to prior administration of pain medication.  Common bile duct:  3 mm diameter  Liver:  Normal appearance  IVC:  Normal appearance  Pancreas:  Body and proximal tail normal appearance, remainder obscured by bowel gas  Spleen:  14.2 cm length the calculated volume of 397 ml.  Small adjacent splenule.  Right kidney:  10.5 cm length. Normal morphology without mass or hydronephrosis.  Left kidney:  12.2 cm length. Normal morphology without mass or hydronephrosis.  Aorta:  Normal caliber  Other:  No free fluid  IMPRESSION: Sludge in gallbladder with a 2.4 cm diameter calcified gallstone at gallbladder neck. Minimal gallbladder wall thickening. Unable to accurately assess for presence of a sonographic Murphy's sign but cannot exclude acute cholecystitis. Incomplete pancreatic visualization. Mild splenomegaly.   Original Report Authenticated By: Ulyses Southward, M.D.      1. Cholelithiasis       MDM  42 year old male with acute upper abdominal pain. Differential includes cholelithiasis, cholecystitis, gastroenteritis, peptic ulcer disease, pancreatitis. We'll get labs and ultrasound of abdomen. Will treat with fluids and Zofran, Dilaudid.        Olivia Mackie, MD 03/28/12 [id number]

## 2012-03-31 ENCOUNTER — Ambulatory Visit (INDEPENDENT_AMBULATORY_CARE_PROVIDER_SITE_OTHER): Payer: BC Managed Care – PPO | Admitting: Surgery

## 2012-03-31 ENCOUNTER — Encounter (INDEPENDENT_AMBULATORY_CARE_PROVIDER_SITE_OTHER): Payer: Self-pay | Admitting: Surgery

## 2012-03-31 VITALS — BP 152/74 | HR 92 | Temp 98.3°F | Resp 16 | Ht 69.0 in | Wt 202.4 lb

## 2012-03-31 DIAGNOSIS — K8020 Calculus of gallbladder without cholecystitis without obstruction: Secondary | ICD-10-CM

## 2012-03-31 NOTE — Progress Notes (Signed)
Patient ID: Cody Medina, male   DOB: [birth year], 42 y.o.   MRN: [id number]  Chief Complaint  Patient presents with  . New Evaluation    eval GB w/ stones    HPI Cody Medina is a 42 y.o. male.   HPI This is a pleasant gentleman referred by the emergency department after an attack of symptomatic cholelithiasis. He presented there with right upper quadrant abdominal pain and nausea and vomiting.  He is now almost pain-free. That was his first attack ever and was quite severe. The pain referred around to the back. He denies fevers. Bowel movements are normal. Past Medical History  Diagnosis Date  . Depression     Past Surgical History  Procedure Date  . Mandible reconstruction     Family History  Problem Relation Age of Onset  . Cancer Maternal Aunt     lung  . Cancer Maternal Grandmother     lung & non hodgkins  . Cancer Maternal Grandfather     non hodgkins    Social History History  Substance Use Topics  . Smoking status: Former Smoker    Quit date: 03/25/1993  . Smokeless tobacco: Never Used  . Alcohol Use: No    Allergies  Allergen Reactions  . Morphine Sulfate     REACTION: vomiting    Current Outpatient Prescriptions  Medication Sig Dispense Refill  . amphetamine-dextroamphetamine (ADDERALL) 10 MG tablet Take 10 mg by mouth Daily.      . clonazePAM (KLONOPIN) 1 MG tablet Take 1 mg by mouth Daily.      Marland Kitchen escitalopram (LEXAPRO) 20 MG tablet Take 20 mg by mouth Daily.      Marland Kitchen HYDROmorphone (DILAUDID) 4 MG tablet Take 4 mg by mouth every 4 (four) hours as needed.      . ondansetron (ZOFRAN) 4 MG tablet Take 1 tablet (4 mg total) by mouth every 6 (six) hours.  12 tablet  0  . oxyCODONE-acetaminophen (PERCOCET/ROXICET) 5-325 MG per tablet Take 2 tablets by mouth every 4 (four) hours as needed for pain.  20 tablet  0    Review of Systems Review of Systems  Constitutional: Negative for fever, chills and unexpected weight change.  HENT: Negative  for hearing loss, congestion, sore throat, trouble swallowing and voice change.   Eyes: Negative for visual disturbance.  Respiratory: Negative for cough and wheezing.   Cardiovascular: Negative for chest pain, palpitations and leg swelling.  Gastrointestinal: Positive for nausea, vomiting and abdominal pain. Negative for diarrhea, constipation, blood in stool, abdominal distention, anal bleeding and rectal pain.  Genitourinary: Negative for hematuria and difficulty urinating.  Musculoskeletal: Negative for arthralgias.  Skin: Negative for rash and wound.  Neurological: Negative for seizures, syncope, weakness and headaches.  Hematological: Negative for adenopathy. Does not bruise/bleed easily.  Psychiatric/Behavioral: Negative for confusion.    Blood pressure 152/74, pulse 92, temperature 98.3 F (36.8 C), temperature source Temporal, resp. rate 16, height 5\' 9"  (1.753 m), weight 202 lb 6.4 oz (91.808 kg).  Physical Exam Physical Exam  Constitutional: He is oriented to person, place, and time. He appears well-developed and well-nourished. No distress.  HENT:  Head: Normocephalic and atraumatic.  Right Ear: External ear normal.  Left Ear: External ear normal.  Nose: Nose normal.  Mouth/Throat: Oropharynx is clear and moist. No oropharyngeal exudate.  Eyes: Conjunctivae normal are normal. Pupils are equal, round, and reactive to light. Right eye exhibits no discharge. Left eye exhibits no discharge. No scleral icterus.  Neck: Normal range of motion. Neck supple. No tracheal deviation present. No thyromegaly present.  Cardiovascular: Normal rate, regular rhythm, normal heart sounds and intact distal pulses.   No murmur heard. Pulmonary/Chest: Effort normal and breath sounds normal. No respiratory distress. He has no wheezes. He has no rales.  Abdominal: Soft. Bowel sounds are normal. He exhibits no distension. There is tenderness. There is no rebound.  Musculoskeletal: Normal range of  motion. He exhibits no edema and no tenderness.  Lymphadenopathy:    He has no cervical adenopathy.  Neurological: He is alert and oriented to person, place, and time.  Skin: Skin is warm and dry. No rash noted. He is not diaphoretic. No erythema.  Psychiatric: His behavior is normal. Judgment normal.    Data Reviewed I have reviewed his ultrasound showing cholelithiasis with a gallstone in the gallbladder neck.  White blood count and liver function tests are normal  Assessment    Systematic cholelithiasis    Plan    Laparoscopic cholecystectomy with possible cholangiogram is recommended. I gave him literature regarding this. I discussed the surgical procedure in detail. I discussed the risks of surgery which includes but is not limited to bleeding, infection, injury, bile leak, injury to other structures, the need to convert to an open procedure, et Karie Soda. He understands and wishes to proceed. Surgery will be scheduled. Likelihood of success is good.       Tex Conroy A 03/31/2012, 10:21 AM

## 2012-04-09 ENCOUNTER — Encounter (HOSPITAL_BASED_OUTPATIENT_CLINIC_OR_DEPARTMENT_OTHER): Payer: Self-pay | Admitting: *Deleted

## 2012-04-10 ENCOUNTER — Encounter (HOSPITAL_BASED_OUTPATIENT_CLINIC_OR_DEPARTMENT_OTHER): Payer: Self-pay | Admitting: *Deleted

## 2012-04-14 NOTE — H&P (Signed)
Patient ID: Cody Medina, male DOB: 12-25-1970, 42 y.o. MRN: [id number]  Chief Complaint   Patient presents with   .  New Evaluation     eval GB w/ stones    HPI  Cody Medina is a 42 y.o. male.  HPI  This is a pleasant gentleman referred by the emergency department after an attack of symptomatic cholelithiasis. He presented there with right upper quadrant abdominal pain and nausea and vomiting. He is now almost pain-free. That was his first attack ever and was quite severe. The pain referred around to the back. He denies fevers. Bowel movements are normal.  Past Medical History   Diagnosis  Date   .  Depression     Past Surgical History   Procedure  Date   .  Mandible reconstruction     Family History   Problem  Relation  Age of Onset   .  Cancer  Maternal Aunt       lung    .  Cancer  Maternal Grandmother       lung & non hodgkins    .  Cancer  Maternal Grandfather       non hodgkins    Social History  History   Substance Use Topics   .  Smoking status:  Former Smoker     Quit date:  03/25/1993   .  Smokeless tobacco:  Never Used   .  Alcohol Use:  No    Allergies   Allergen  Reactions   .  Morphine Sulfate      REACTION: vomiting    Current Outpatient Prescriptions   Medication  Sig  Dispense  Refill   .  amphetamine-dextroamphetamine (ADDERALL) 10 MG tablet  Take 10 mg by mouth Daily.     .  clonazePAM (KLONOPIN) 1 MG tablet  Take 1 mg by mouth Daily.     Marland Kitchen  escitalopram (LEXAPRO) 20 MG tablet  Take 20 mg by mouth Daily.     Marland Kitchen  HYDROmorphone (DILAUDID) 4 MG tablet  Take 4 mg by mouth every 4 (four) hours as needed.     .  ondansetron (ZOFRAN) 4 MG tablet  Take 1 tablet (4 mg total) by mouth every 6 (six) hours.  12 tablet  0   .  oxyCODONE-acetaminophen (PERCOCET/ROXICET) 5-325 MG per tablet  Take 2 tablets by mouth every 4 (four) hours as needed for pain.  20 tablet  0    Review of Systems  Review of Systems  Constitutional: Negative for  fever, chills and unexpected weight change.  HENT: Negative for hearing loss, congestion, sore throat, trouble swallowing and voice change.  Eyes: Negative for visual disturbance.  Respiratory: Negative for cough and wheezing.  Cardiovascular: Negative for chest pain, palpitations and leg swelling.  Gastrointestinal: Positive for nausea, vomiting and abdominal pain. Negative for diarrhea, constipation, blood in stool, abdominal distention, anal bleeding and rectal pain.  Genitourinary: Negative for hematuria and difficulty urinating.  Musculoskeletal: Negative for arthralgias.  Skin: Negative for rash and wound.  Neurological: Negative for seizures, syncope, weakness and headaches.  Hematological: Negative for adenopathy. Does not bruise/bleed easily.  Psychiatric/Behavioral: Negative for confusion.   Blood pressure 152/74, pulse 92, temperature 98.3 F (36.8 C), temperature source Temporal, resp. rate 16, height 5\' 9"  (1.753 m), weight 202 lb 6.4 oz (91.808 kg).  Physical Exam  Physical Exam  Constitutional: He is oriented to person, place, and time. He appears well-developed and well-nourished. No distress.  HENT:  Head: Normocephalic and atraumatic.  Right Ear: External ear normal.  Left Ear: External ear normal.  Nose: Nose normal.  Mouth/Throat: Oropharynx is clear and moist. No oropharyngeal exudate.  Eyes: Conjunctivae normal are normal. Pupils are equal, round, and reactive to light. Right eye exhibits no discharge. Left eye exhibits no discharge. No scleral icterus.  Neck: Normal range of motion. Neck supple. No tracheal deviation present. No thyromegaly present.  Cardiovascular: Normal rate, regular rhythm, normal heart sounds and intact distal pulses.  No murmur heard.  Pulmonary/Chest: Effort normal and breath sounds normal. No respiratory distress. He has no wheezes. He has no rales.  Abdominal: Soft. Bowel sounds are normal. He exhibits no distension. There is tenderness.  There is no rebound.  Musculoskeletal: Normal range of motion. He exhibits no edema and no tenderness.  Lymphadenopathy:  He has no cervical adenopathy.  Neurological: He is alert and oriented to person, place, and time.  Skin: Skin is warm and dry. No rash noted. He is not diaphoretic. No erythema.  Psychiatric: His behavior is normal. Judgment normal.   Data Reviewed  I have reviewed his ultrasound showing cholelithiasis with a gallstone in the gallbladder neck. White blood count and liver function tests are normal  Assessment   Systematic cholelithiasis   Plan   Laparoscopic cholecystectomy with possible cholangiogram is recommended. I gave him literature regarding this. I discussed the surgical procedure in detail. I discussed the risks of surgery which includes but is not limited to bleeding, infection, injury, bile leak, injury to other structures, the need to convert to an open procedure, et Karie Soda. He understands and wishes to proceed. Surgery will be scheduled. Likelihood of success is good.

## 2012-04-15 ENCOUNTER — Encounter (HOSPITAL_BASED_OUTPATIENT_CLINIC_OR_DEPARTMENT_OTHER): Payer: Self-pay | Admitting: Anesthesiology

## 2012-04-15 ENCOUNTER — Encounter (HOSPITAL_BASED_OUTPATIENT_CLINIC_OR_DEPARTMENT_OTHER)
Admission: RE | Disposition: A | Discharge status: Home / Self Care | Payer: Self-pay | Source: Ambulatory Visit | Attending: Surgery

## 2012-04-15 ENCOUNTER — Encounter (HOSPITAL_BASED_OUTPATIENT_CLINIC_OR_DEPARTMENT_OTHER): Payer: Self-pay | Admitting: *Deleted

## 2012-04-15 ENCOUNTER — Ambulatory Visit (HOSPITAL_BASED_OUTPATIENT_CLINIC_OR_DEPARTMENT_OTHER): Payer: BC Managed Care – PPO | Admitting: Anesthesiology

## 2012-04-15 ENCOUNTER — Ambulatory Visit (HOSPITAL_BASED_OUTPATIENT_CLINIC_OR_DEPARTMENT_OTHER)
Admission: RE | Admit: 2012-04-15 | Discharge: 2012-04-15 | Disposition: A | Discharge status: Home / Self Care | Payer: BC Managed Care – PPO | Source: Ambulatory Visit | Attending: Surgery | Admitting: Surgery

## 2012-04-15 DIAGNOSIS — F3289 Other specified depressive episodes: Secondary | ICD-10-CM | POA: Insufficient documentation

## 2012-04-15 DIAGNOSIS — F329 Major depressive disorder, single episode, unspecified: Secondary | ICD-10-CM | POA: Insufficient documentation

## 2012-04-15 DIAGNOSIS — K219 Gastro-esophageal reflux disease without esophagitis: Secondary | ICD-10-CM | POA: Insufficient documentation

## 2012-04-15 DIAGNOSIS — K812 Acute cholecystitis with chronic cholecystitis: Secondary | ICD-10-CM

## 2012-04-15 DIAGNOSIS — I10 Essential (primary) hypertension: Secondary | ICD-10-CM | POA: Insufficient documentation

## 2012-04-15 DIAGNOSIS — K810 Acute cholecystitis: Secondary | ICD-10-CM | POA: Insufficient documentation

## 2012-04-15 DIAGNOSIS — Z79899 Other long term (current) drug therapy: Secondary | ICD-10-CM | POA: Insufficient documentation

## 2012-04-15 HISTORY — PX: CHOLECYSTECTOMY: SHX55

## 2012-04-15 HISTORY — DX: Attention-deficit hyperactivity disorder, unspecified type: F90.9

## 2012-04-15 HISTORY — DX: Calculus of gallbladder without cholecystitis without obstruction: K80.20

## 2012-04-15 SURGERY — LAPAROSCOPIC CHOLECYSTECTOMY
Anesthesia: General | Site: Abdomen | Wound class: Clean

## 2012-04-15 MED ORDER — DEXAMETHASONE SODIUM PHOSPHATE 4 MG/ML IJ SOLN
INTRAMUSCULAR | Status: DC | PRN
  Administered 2012-04-15: 10 mg via INTRAVENOUS

## 2012-04-15 MED ORDER — ROCURONIUM BROMIDE 100 MG/10ML IV SOLN
INTRAVENOUS | Status: DC | PRN
  Administered 2012-04-15: 30 mg via INTRAVENOUS

## 2012-04-15 MED ORDER — LACTATED RINGERS IV SOLN
INTRAVENOUS | Status: DC
  Administered 2012-04-15: 12:00:00 via INTRAVENOUS

## 2012-04-15 MED ORDER — FENTANYL CITRATE 0.05 MG/ML IJ SOLN
INTRAMUSCULAR | Status: DC | PRN
  Administered 2012-04-15 (×2): 25 ug via INTRAVENOUS
  Administered 2012-04-15: 50 ug via INTRAVENOUS
  Administered 2012-04-15: 100 ug via INTRAVENOUS

## 2012-04-15 MED ORDER — GLYCOPYRROLATE 0.2 MG/ML IJ SOLN
INTRAMUSCULAR | Status: DC | PRN
  Administered 2012-04-15: .5 mg via INTRAVENOUS

## 2012-04-15 MED ORDER — KETOROLAC TROMETHAMINE 30 MG/ML IJ SOLN
INTRAMUSCULAR | Status: DC | PRN
  Administered 2012-04-15: 30 mg via INTRAVENOUS

## 2012-04-15 MED ORDER — MEPERIDINE HCL 25 MG/ML IJ SOLN: 6.2500 mg | INTRAMUSCULAR | Status: DC | PRN

## 2012-04-15 MED ORDER — MIDAZOLAM HCL 2 MG/2ML IJ SOLN: 1.0000 mg | INTRAMUSCULAR | Status: DC | PRN

## 2012-04-15 MED ORDER — SODIUM CHLORIDE 0.9 % IR SOLN
Status: DC | PRN
  Administered 2012-04-15: 1000 mL

## 2012-04-15 MED ORDER — HYDROMORPHONE HCL PF 1 MG/ML IJ SOLN
0.2500 mg | INTRAMUSCULAR | Status: DC | PRN
  Administered 2012-04-15 (×2): 0.5 mg via INTRAVENOUS

## 2012-04-15 MED ORDER — ONDANSETRON HCL 4 MG/2ML IJ SOLN: 4.0000 mg | Freq: Once | INTRAMUSCULAR | Status: DC | PRN

## 2012-04-15 MED ORDER — PROPOFOL 10 MG/ML IV BOLUS
INTRAVENOUS | Status: DC | PRN
  Administered 2012-04-15: 150 mg via INTRAVENOUS

## 2012-04-15 MED ORDER — ONDANSETRON HCL 4 MG/2ML IJ SOLN
INTRAMUSCULAR | Status: DC | PRN
  Administered 2012-04-15: 4 mg via INTRAVENOUS

## 2012-04-15 MED ORDER — BUPIVACAINE-EPINEPHRINE PF 0.5-1:200000 % IJ SOLN
INTRAMUSCULAR | Status: DC | PRN
  Administered 2012-04-15: 20 mL

## 2012-04-15 MED ORDER — LIDOCAINE HCL (CARDIAC) 20 MG/ML IV SOLN
INTRAVENOUS | Status: DC | PRN
  Administered 2012-04-15: 50 mg via INTRAVENOUS

## 2012-04-15 MED ORDER — NEOSTIGMINE METHYLSULFATE 1 MG/ML IJ SOLN
INTRAMUSCULAR | Status: DC | PRN
  Administered 2012-04-15: 3 mg via INTRAVENOUS

## 2012-04-15 MED ORDER — FENTANYL CITRATE 0.05 MG/ML IJ SOLN: 50.0000 ug | INTRAMUSCULAR | Status: DC | PRN

## 2012-04-15 MED ORDER — DEXTROSE 5 % IV SOLN
2.0000 g | INTRAVENOUS | Status: AC
  Administered 2012-04-15: 2 g via INTRAVENOUS

## 2012-04-15 MED ORDER — OXYCODONE HCL 5 MG/5ML PO SOLN: 5.0000 mg | Freq: Once | ORAL | Status: AC | PRN

## 2012-04-15 MED ORDER — MIDAZOLAM HCL 2 MG/ML PO SYRP: 12.0000 mg | ORAL_SOLUTION | Freq: Once | ORAL | Status: DC | PRN

## 2012-04-15 MED ORDER — OXYCODONE HCL 5 MG PO TABS
5.0000 mg | ORAL_TABLET | Freq: Once | ORAL | Status: AC | PRN
  Administered 2012-04-15: 5 mg via ORAL

## 2012-04-15 MED ORDER — OXYCODONE-ACETAMINOPHEN 5-325 MG PO TABS: 2.0000 | ORAL_TABLET | ORAL | Status: DC | PRN

## 2012-04-15 MED ORDER — PROPOFOL 10 MG/ML IV BOLUS: INTRAVENOUS | Status: DC | PRN

## 2012-04-15 MED ORDER — MIDAZOLAM HCL 5 MG/5ML IJ SOLN
INTRAMUSCULAR | Status: DC | PRN
  Administered 2012-04-15: 2 mg via INTRAVENOUS

## 2012-04-15 SURGICAL SUPPLY — 37 items
APPLIER CLIP 5 13 M/L LIGAMAX5 (MISCELLANEOUS) ×3
BANDAGE ADHESIVE 1X3 (GAUZE/BANDAGES/DRESSINGS) ×12 IMPLANT
BENZOIN TINCTURE PRP APPL 2/3 (GAUZE/BANDAGES/DRESSINGS) ×3 IMPLANT
BLADE SURG ROTATE 9660 (MISCELLANEOUS) ×3 IMPLANT
CANISTER SUCTION 2500CC (MISCELLANEOUS) ×3 IMPLANT
CHLORAPREP W/TINT 26ML (MISCELLANEOUS) ×3 IMPLANT
CLIP APPLIE 5 13 M/L LIGAMAX5 (MISCELLANEOUS) ×2 IMPLANT
CLOTH BEACON ORANGE TIMEOUT ST (SAFETY) ×3 IMPLANT
COVER MAYO STAND STRL (DRAPES) IMPLANT
DECANTER SPIKE VIAL GLASS SM (MISCELLANEOUS) IMPLANT
DRAPE C-ARM 42X72 X-RAY (DRAPES) IMPLANT
DRAPE UTILITY XL STRL (DRAPES) ×3 IMPLANT
ELECT REM PT RETURN 9FT ADLT (ELECTROSURGICAL) ×3
ELECTRODE REM PT RTRN 9FT ADLT (ELECTROSURGICAL) ×2 IMPLANT
GLOVE BIO SURGEON STRL SZ 6.5 (GLOVE) ×3 IMPLANT
GLOVE BIO SURGEON STRL SZ8 (GLOVE) ×3 IMPLANT
GLOVE BIOGEL PI IND STRL 8 (GLOVE) ×2 IMPLANT
GLOVE BIOGEL PI INDICATOR 8 (GLOVE) ×1
GLOVE SURG SIGNA 7.5 PF LTX (GLOVE) ×3 IMPLANT
GOWN PREVENTION PLUS XLARGE (GOWN DISPOSABLE) ×9 IMPLANT
GOWN STRL NON-REIN LRG LVL3 (GOWN DISPOSABLE) IMPLANT
NS IRRIG 1000ML POUR BTL (IV SOLUTION) IMPLANT
PACK BASIN DAY SURGERY FS (CUSTOM PROCEDURE TRAY) ×3 IMPLANT
POUCH SPECIMEN RETRIEVAL 10MM (ENDOMECHANICALS) ×3 IMPLANT
SCISSORS LAP 5X35 DISP (ENDOMECHANICALS) IMPLANT
SET CHOLANGIOGRAPH 5 50 .035 (SET/KITS/TRAYS/PACK) IMPLANT
SET IRRIG TUBING LAPAROSCOPIC (IRRIGATION / IRRIGATOR) ×3 IMPLANT
SLEEVE ENDOPATH XCEL 5M (ENDOMECHANICALS) ×6 IMPLANT
SLEEVE SCD COMPRESS KNEE MED (MISCELLANEOUS) ×3 IMPLANT
SPECIMEN JAR SMALL (MISCELLANEOUS) ×3 IMPLANT
SUT MON AB 4-0 PC3 18 (SUTURE) ×3 IMPLANT
TOWEL OR 17X24 6PK STRL BLUE (TOWEL DISPOSABLE) ×3 IMPLANT
TOWEL OR NON WOVEN STRL DISP B (DISPOSABLE) ×3 IMPLANT
TRAY LAPAROSCOPIC (CUSTOM PROCEDURE TRAY) ×3 IMPLANT
TROCAR XCEL BLUNT TIP 100MML (ENDOMECHANICALS) ×3 IMPLANT
TROCAR XCEL NON-BLD 5MMX100MML (ENDOMECHANICALS) ×3 IMPLANT
TUBE CONNECTING 20X1/4 (TUBING) ×3 IMPLANT

## 2012-04-15 NOTE — Anesthesia Preprocedure Evaluation (Addendum)
Anesthesia Evaluation  Patient identified by MRN, date of birth, ID band Patient awake    Reviewed: Allergy & Precautions, H&P , NPO status , Patient's Chart, lab work & pertinent test results  Airway       Dental   Pulmonary          Cardiovascular hypertension, Pt. on medications     Neuro/Psych Depression    GI/Hepatic GERD-  Medicated and Controlled,  Endo/Other    Renal/GU      Musculoskeletal   Abdominal   Peds  Hematology   Anesthesia Other Findings   Reproductive/Obstetrics                           Anesthesia Physical Anesthesia Plan  ASA: II  Anesthesia Plan:    Post-op Pain Management:    Induction:   Airway Management Planned:   Additional Equipment:   Intra-op Plan:   Post-operative Plan:   Informed Consent:   Plan Discussed with:   Anesthesia Plan Comments:         Anesthesia Quick Evaluation

## 2012-04-15 NOTE — Anesthesia Postprocedure Evaluation (Signed)
Anesthesia Post Note  Patient: Cody Medina  Procedure(s) Performed: Procedure(s) (LRB): LAPAROSCOPIC CHOLECYSTECTOMY (N/A)  Anesthesia type: general  Patient location: PACU  Post pain: Pain level controlled  Post assessment: Patient's Cardiovascular Status Stable  Last Vitals:  Filed Vitals:   04/15/12 1400  BP:   Pulse: 57  Temp:   Resp: 21    Post vital signs: Reviewed and stable  Level of consciousness: sedated  Complications: No apparent anesthesia complications

## 2012-04-15 NOTE — Transfer of Care (Signed)
Immediate Anesthesia Transfer of Care Note  Patient: Cody Medina  Procedure(s) Performed: Procedure(s) (LRB) with comments: LAPAROSCOPIC CHOLECYSTECTOMY (N/A)  Patient Location: PACU  Anesthesia Type:General  Level of Consciousness: awake and alert   Airway & Oxygen Therapy: Patient Spontanous Breathing and Patient connected to face mask oxygen  Post-op Assessment: Report given to PACU RN and Post -op Vital signs reviewed and stable  Post vital signs: Reviewed and stable  Complications: No apparent anesthesia complications

## 2012-04-15 NOTE — Op Note (Signed)
Laparoscopic Cholecystectomy Procedure Note  Indications: This patient presents with symptomatic gallbladder disease and will undergo laparoscopic cholecystectomy.  Pre-operative Diagnosis: Calculus of gallbladder without mention of cholecystitis or obstruction  Post-operative Diagnosis: Calculus of gallbladder with other cholecystitis, without mention of obstruction  Surgeon: Abigail Miyamoto A   Assistants: 0  Anesthesia: General endotracheal anesthesia  ASA Class: 1  Procedure Details  The patient was seen again in the Holding Room. The risks, benefits, complications, treatment options, and expected outcomes were discussed with the patient. The possibilities of reaction to medication, pulmonary aspiration, perforation of viscus, bleeding, recurrent infection, finding a normal gallbladder, the need for additional procedures, failure to diagnose a condition, the possible need to convert to an open procedure, and creating a complication requiring transfusion or operation were discussed with the patient. The likelihood of improving the patient's symptoms with return to their baseline status is good.  The patient and/or family concurred with the proposed plan, giving informed consent. The site of surgery properly noted. The patient was taken to Operating Room, identified as [facility] and the procedure verified as Laparoscopic Cholecystectomy with Intraoperative Cholangiogram. A Time Out was held and the above information confirmed.  Prior to the induction of general anesthesia, antibiotic prophylaxis was administered. General endotracheal anesthesia was then administered and tolerated well. After the induction, the abdomen was prepped with Chloraprep and draped in sterile fashion. The patient was positioned in the supine position.  Local anesthetic agent was injected into the skin near the umbilicus and an incision made. We dissected down to the abdominal fascia with blunt dissection.   The fascia was incised vertically and we entered the peritoneal cavity bluntly.  A pursestring suture of 0-Vicryl was placed around the fascial opening.  The Hasson cannula was inserted and secured with the stay suture.  Pneumoperitoneum was then created with CO2 and tolerated well without any adverse changes in the patient's vital signs. An 11-mm port was placed in the subxiphoid position.  Two 5-mm ports were placed in the right upper quadrant. All skin incisions were infiltrated with a local anesthetic agent before making the incision and placing the trocars.   We positioned the patient in reverse Trendelenburg, tilted slightly to the patient's left.  The gallbladder was identified, the fundus grasped and retracted cephalad. Adhesions were lysed bluntly and with the electrocautery where indicated, taking care not to injure any adjacent organs or viscus. The infundibulum was grasped and retracted laterally, exposing the peritoneum overlying the triangle of Calot. This was then divided and exposed in a blunt fashion. The cystic duct was clearly identified and bluntly dissected circumferentially. A critical view of the cystic duct and cystic artery was obtained.  The cystic duct was then ligated with clips and divided. The cystic artery was, dissected free, ligated with clips and divided as well.   The gallbladder was dissected from the liver bed in retrograde fashion with the electrocautery. The gallbladder was removed and placed in an Endocatch sac. The liver bed was irrigated and inspected. Hemostasis was achieved with the electrocautery. Copious irrigation was utilized and was repeatedly aspirated until clear.  The gallbladder and Endocatch sac were then removed through the umbilical port site.  The pursestring suture was used to close the umbilical fascia.    We again inspected the right upper quadrant for hemostasis.  Pneumoperitoneum was released as we removed the trocars.  4-0 Monocryl was used to  close the skin.   Benzoin, steri-strips, and clean dressings were applied. The patient  was then extubated and brought to the recovery room in stable condition. Instrument, sponge, and needle counts were correct at closure and at the conclusion of the case.   Findings: Cholecystitis with Cholelithiasis  Estimated Blood Loss: Minimal         Drains: 0         Specimens: Gallbladder           Complications: None; patient tolerated the procedure well.         Disposition: PACU - hemodynamically stable.         Condition: stable

## 2012-04-15 NOTE — Anesthesia Procedure Notes (Addendum)
Procedure Name: Intubation Performed by: York Grice Pre-anesthesia Checklist: Patient identified, Timeout performed, Emergency Drugs available, Suction available and Patient being monitored Patient Re-evaluated:Patient Re-evaluated prior to inductionOxygen Delivery Method: Circle system utilized Preoxygenation: Pre-oxygenation with 100% oxygen Intubation Type: IV induction Ventilation: Mask ventilation without difficulty Laryngoscope Size: Miller and 2 Grade View: Grade I Tube type: Oral Number of attempts: 1 Airway Equipment and Method: Stylet Placement Confirmation: ETT inserted through vocal cords under direct vision,  breath sounds checked- equal and bilateral and positive ETCO2 Secured at: 22 cm Tube secured with: Tape Dental Injury: Teeth and Oropharynx as per pre-operative assessment    Performed by: York Grice Tube size: 8.0 mm

## 2012-04-15 NOTE — Interval H&P Note (Signed)
History and Physical Interval Note: H and P  04/15/2012 11:20 AM  Cody Medina  has presented today for surgery, with the diagnosis of symptomatic cholelithasis  The various methods of treatment have been discussed with the patient and family. After consideration of risks, benefits and other options for treatment, the patient has consented to  Procedure(s) (LRB) with comments: LAPAROSCOPIC CHOLECYSTECTOMY (N/A) INTRAOPERATIVE CHOLANGIOGRAM (N/A) as a surgical intervention .  The patient's history has been reviewed, patient examined, no change in status, stable for surgery.  I have reviewed the patient's chart and labs.  Questions were answered to the patient's satisfaction.     Daksh Coates A

## 2012-04-16 ENCOUNTER — Encounter (HOSPITAL_BASED_OUTPATIENT_CLINIC_OR_DEPARTMENT_OTHER): Payer: Self-pay | Admitting: Surgery

## 2012-04-16 ENCOUNTER — Encounter (INDEPENDENT_AMBULATORY_CARE_PROVIDER_SITE_OTHER): Payer: Self-pay | Admitting: General Surgery

## 2012-04-17 ENCOUNTER — Telehealth (INDEPENDENT_AMBULATORY_CARE_PROVIDER_SITE_OTHER): Payer: Self-pay | Admitting: General Surgery

## 2012-04-17 NOTE — Telephone Encounter (Signed)
Patient called in stating he has some air pockets and wanted to know what he can take to help resolve. Patient has been eating soups, soft foods and drinking water. He has not been drinking any sodas. Patient confirmed he does not have any history of problems with his colon or intestines. Patient advised to try Gas-x and to take it in small increments to make sure he can tolerate it well. Advised that because he did not have much in his system prior to surgery and he is adding foods in, it is not that unusual to have air in his digestive system.

## 2012-04-30 ENCOUNTER — Encounter (INDEPENDENT_AMBULATORY_CARE_PROVIDER_SITE_OTHER): Payer: Self-pay | Admitting: Surgery

## 2012-04-30 ENCOUNTER — Ambulatory Visit (INDEPENDENT_AMBULATORY_CARE_PROVIDER_SITE_OTHER): Payer: BC Managed Care – PPO | Admitting: Surgery

## 2012-04-30 VITALS — BP 124/78 | HR 74 | Temp 98.1°F | Resp 16 | Ht 69.0 in | Wt 214.2 lb

## 2012-04-30 DIAGNOSIS — Z09 Encounter for follow-up examination after completed treatment for conditions other than malignant neoplasm: Secondary | ICD-10-CM

## 2012-04-30 NOTE — Progress Notes (Signed)
Subjective:     Patient ID: Cody Medina, male   DOB: [birth year], 42 y.o.   MRN: [id number]  HPI He is here for his first postop visit status post laparoscopic cholecystectomy. He is doing very well and has no complaints. He is eating well and moving his bowels well.  Review of Systems     Objective:   Physical Exam His incisions are well healed. The final pathology showed chronic cholecystitis with acute cholecystitis an abscess as well    Assessment:     Patient stable postop    Plan:     He may return to normal activity. I will see him back as needed.

## 2012-05-12 ENCOUNTER — Telehealth (INDEPENDENT_AMBULATORY_CARE_PROVIDER_SITE_OTHER): Payer: Self-pay | Admitting: General Surgery

## 2012-05-12 ENCOUNTER — Other Ambulatory Visit (INDEPENDENT_AMBULATORY_CARE_PROVIDER_SITE_OTHER): Payer: Self-pay | Admitting: Surgery

## 2012-05-12 DIAGNOSIS — R109 Unspecified abdominal pain: Secondary | ICD-10-CM

## 2012-05-12 NOTE — Telephone Encounter (Signed)
Called patient to let him know that he has a hidra scan on 05-18-12 at [facility] and get lab work done tomorrow 05-13-12

## 2012-05-12 NOTE — Telephone Encounter (Signed)
Pt called to report his is still experiencing sharp, intermittent pain under his Rt ribs.  Pt states this was discussed at his appt on 04/30/12 with Dr. Magnus Ivan.  Paged and updated Dr. Magnus Ivan; ordered HIDA scan, CBC and CMET.

## 2012-05-13 ENCOUNTER — Encounter (INDEPENDENT_AMBULATORY_CARE_PROVIDER_SITE_OTHER): Payer: Self-pay | Admitting: Surgery

## 2012-05-13 ENCOUNTER — Telehealth (INDEPENDENT_AMBULATORY_CARE_PROVIDER_SITE_OTHER): Payer: Self-pay | Admitting: General Surgery

## 2012-05-13 ENCOUNTER — Ambulatory Visit (INDEPENDENT_AMBULATORY_CARE_PROVIDER_SITE_OTHER): Payer: BC Managed Care – PPO | Admitting: Surgery

## 2012-05-13 VITALS — BP 144/88 | HR 112 | Resp 16 | Ht 69.0 in | Wt 216.0 lb

## 2012-05-13 LAB — CBC
HCT: 41.8 % (ref 39.0–52.0)
Hemoglobin: 13.1 g/dL (ref 13.0–17.0)
MCH: 19.5 pg — ABNORMAL LOW (ref 26.0–34.0)
MCV: 62.2 fL — ABNORMAL LOW (ref 78.0–100.0)
RBC: 6.71 MIL/uL — ABNORMAL HIGH (ref 4.22–5.81)

## 2012-05-13 LAB — COMPREHENSIVE METABOLIC PANEL
BUN: 11 mg/dL (ref 6–23)
CO2: 27 mEq/L (ref 19–32)
Creat: 1 mg/dL (ref 0.50–1.35)
Glucose, Bld: 121 mg/dL — ABNORMAL HIGH (ref 70–99)
Sodium: 141 mEq/L (ref 135–145)
Total Bilirubin: 0.5 mg/dL (ref 0.3–1.2)
Total Protein: 7.3 g/dL (ref 6.0–8.3)

## 2012-05-13 NOTE — Telephone Encounter (Signed)
I called [facility] and gave order to Amy to have lab work done stat this am/ [phone]/gy

## 2012-05-13 NOTE — Telephone Encounter (Signed)
Cody Medina called to report that Cody Medina has noted an increasing red area developing over several days, under rt rib area, it is now approximately size of fist. No fever, nausea or vomiting reported. He is still experiencing pain as described yesterday. He is scheduled for HIDA scan tomorrow and was not able to get labs done this am because he was at work/ I reviewed this with Dr. Magnus Ivan in OR/ He said for pt to get labs done this am and to be seen in urgent office today. I scheduled appt with Dr. Jamey Ripa today in urgent office and told Cody Medina to have Cody Medina get labs done this am. She is aware and will contact pt/gy

## 2012-05-13 NOTE — Progress Notes (Signed)
CC:  Chief Complaint  Patient presents with  . Post-op Problem    pain and redness status post lap chole 04/15/12    HPI: He is s/p Lap chole on 1/22. He comes to the urgent office today with ongoing RUQ pain, especially laterally and around the middle of the UQ trocar sites. No other GI sx. No fever or chills, has been working and pain got worsse after he started doing more heavy work  EXAM: Vital:BP 144/88  Pulse 112  Resp 16  Ht 5\' 9"  (1.753 m)  Wt 216 lb (97.977 kg)  BMI 31.88 kg/m2 Gen: alert, NAD Abd: benign except vry mile ruq tender, seems supericial. A hint of erythema around the mid trocar site but no evidence of iabscess.    IMP: RUQ pain, sp lap chole for acute cholecytitis   PLAN: Will cancel HIDA and get CT instead to R/O intra abd process.  Discussed with him and wife and reviewed with Dr Magnus Ivan

## 2012-05-14 ENCOUNTER — Ambulatory Visit
Admission: RE | Admit: 2012-05-14 | Discharge: 2012-05-14 | Disposition: A | Discharge status: Home / Self Care | Payer: BC Managed Care – PPO | Source: Ambulatory Visit | Attending: Surgery | Admitting: Surgery

## 2012-05-14 DIAGNOSIS — R1011 Right upper quadrant pain: Secondary | ICD-10-CM

## 2012-05-14 MED ORDER — IOHEXOL 300 MG/ML  SOLN
125.0000 mL | Freq: Once | INTRAMUSCULAR | Status: AC | PRN
  Administered 2012-05-14: 125 mL via INTRAVENOUS

## 2012-05-15 ENCOUNTER — Telehealth (INDEPENDENT_AMBULATORY_CARE_PROVIDER_SITE_OTHER): Payer: Self-pay | Admitting: General Surgery

## 2012-05-15 ENCOUNTER — Other Ambulatory Visit (INDEPENDENT_AMBULATORY_CARE_PROVIDER_SITE_OTHER): Payer: Self-pay | Admitting: Surgery

## 2012-05-15 ENCOUNTER — Encounter (INDEPENDENT_AMBULATORY_CARE_PROVIDER_SITE_OTHER): Payer: Self-pay | Admitting: General Surgery

## 2012-05-15 NOTE — Telephone Encounter (Signed)
Message copied by Wilder Glade on Fri May 15, 2012  1:49 PM ------      Message from: Marin Shutter      Created: Fri May 15, 2012 12:48 PM      Regarding: Dr. Magnus Ivan      Contact: [phone]       Pt came in wanting to know:            1) What test is called      2) What they found on CT scan. thx       ------

## 2012-05-15 NOTE — Telephone Encounter (Signed)
Called patient to let him know that his CT showed that he had fluid collection that could be a bile leak. Patient is schedule for a HIDA scan on 05-18-12 @ 11:15 at [facility] and he is aware that he will schedule for the test and that he will need to be NPO 6 hrs before test. He wanted something for pain. I paged Dr Magnus Ivan and he stated that I can call in the Vicodin 5/325 at the [facility] on [address] [phone]. And the patient wanted a note to be out of work for today and Monday and the note is a the check in desk

## 2012-05-15 NOTE — Telephone Encounter (Signed)
Called patient back and talked to him about what type of test he is having and where the test will be. He is having a HIDA scan and it will be a [employer]. He wanted to know about what was the finding on the CT and I told him that it showed a bile leak that is why we are doing a HIDA scan

## 2012-05-18 ENCOUNTER — Encounter (HOSPITAL_COMMUNITY)
Admission: RE | Admit: 2012-05-18 | Discharge: 2012-05-18 | Disposition: A | Discharge status: Home / Self Care | Payer: BC Managed Care – PPO | Source: Ambulatory Visit | Attending: Surgery | Admitting: Surgery

## 2012-05-18 ENCOUNTER — Other Ambulatory Visit (INDEPENDENT_AMBULATORY_CARE_PROVIDER_SITE_OTHER): Payer: Self-pay | Admitting: Surgery

## 2012-05-18 ENCOUNTER — Encounter (HOSPITAL_COMMUNITY): Payer: BC Managed Care – PPO

## 2012-05-18 DIAGNOSIS — K839 Disease of biliary tract, unspecified: Secondary | ICD-10-CM | POA: Insufficient documentation

## 2012-05-18 MED ORDER — TECHNETIUM TC 99M MEBROFENIN IV KIT
5.0000 | PACK | Freq: Once | INTRAVENOUS | Status: AC | PRN
  Administered 2012-05-18: 5 via INTRAVENOUS

## 2012-05-19 ENCOUNTER — Telehealth (INDEPENDENT_AMBULATORY_CARE_PROVIDER_SITE_OTHER): Payer: Self-pay

## 2012-05-19 NOTE — Telephone Encounter (Signed)
The pt's wife called reporting after the HIDA scan he has painful bumps on both sides of the groin.  It doesn't itch, or drain anything.  They look like blisters.  He has not changed anything as far as soaps, detergents.  And he hasn't bought new underwear in which the elastic was irritating him.  I talked to Dr Andrey Campanile and he has not heard of this kind of reaction.  He asked that he watch it.  He can take Tylenol or Ibuprofen for the discomfort.  I notified the pt and also asked that they call and notify the radiology department to see if they have heard of this before.

## 2012-05-25 ENCOUNTER — Encounter (INDEPENDENT_AMBULATORY_CARE_PROVIDER_SITE_OTHER): Payer: BC Managed Care – PPO | Admitting: Surgery

## 2012-12-17 ENCOUNTER — Encounter (HOSPITAL_COMMUNITY): Payer: Self-pay | Admitting: Emergency Medicine

## 2012-12-17 ENCOUNTER — Emergency Department (HOSPITAL_COMMUNITY)
Admission: EM | Admit: 2012-12-17 | Discharge: 2012-12-17 | Disposition: A | Discharge status: Home / Self Care | Payer: BC Managed Care – PPO | Attending: Emergency Medicine | Admitting: Emergency Medicine

## 2012-12-17 ENCOUNTER — Emergency Department (HOSPITAL_COMMUNITY): Payer: BC Managed Care – PPO

## 2012-12-17 DIAGNOSIS — R51 Headache: Secondary | ICD-10-CM | POA: Insufficient documentation

## 2012-12-17 DIAGNOSIS — Z87891 Personal history of nicotine dependence: Secondary | ICD-10-CM | POA: Insufficient documentation

## 2012-12-17 DIAGNOSIS — F909 Attention-deficit hyperactivity disorder, unspecified type: Secondary | ICD-10-CM | POA: Insufficient documentation

## 2012-12-17 DIAGNOSIS — I10 Essential (primary) hypertension: Secondary | ICD-10-CM | POA: Insufficient documentation

## 2012-12-17 DIAGNOSIS — F3289 Other specified depressive episodes: Secondary | ICD-10-CM | POA: Insufficient documentation

## 2012-12-17 DIAGNOSIS — Z8719 Personal history of other diseases of the digestive system: Secondary | ICD-10-CM | POA: Insufficient documentation

## 2012-12-17 DIAGNOSIS — R42 Dizziness and giddiness: Secondary | ICD-10-CM | POA: Insufficient documentation

## 2012-12-17 DIAGNOSIS — R1110 Vomiting, unspecified: Secondary | ICD-10-CM

## 2012-12-17 DIAGNOSIS — Z79899 Other long term (current) drug therapy: Secondary | ICD-10-CM | POA: Insufficient documentation

## 2012-12-17 DIAGNOSIS — F329 Major depressive disorder, single episode, unspecified: Secondary | ICD-10-CM | POA: Insufficient documentation

## 2012-12-17 LAB — BASIC METABOLIC PANEL
CO2: 28 mEq/L (ref 19–32)
Calcium: 9.8 mg/dL (ref 8.4–10.5)
GFR calc non Af Amer: >90 mL/min (ref >90)
Potassium: 3.4 mEq/L — ABNORMAL LOW (ref 3.5–5.1)
Sodium: 141 mEq/L (ref 135–145)

## 2012-12-17 LAB — GLUCOSE, CAPILLARY: Glucose-Capillary: 91 mg/dL (ref 70–99)

## 2012-12-17 LAB — CBC WITH DIFFERENTIAL/PLATELET
Eosinophils Relative: 1 % (ref 0–5)
Hemoglobin: 14.4 g/dL (ref 13.0–17.0)
Lymphs Abs: 1.1 10*3/uL (ref 0.7–4.0)
MCV: 61.9 fL — ABNORMAL LOW (ref 78.0–100.0)
Monocytes Absolute: 0.4 10*3/uL (ref 0.1–1.0)
Monocytes Relative: 8 % (ref 3–12)
Neutrophils Relative %: 71 % (ref 43–77)
Platelets: 121 10*3/uL — ABNORMAL LOW (ref 150–400)
RBC: 7.13 MIL/uL — ABNORMAL HIGH (ref 4.22–5.81)
WBC: 5.3 10*3/uL (ref 4.0–10.5)

## 2012-12-17 MED ORDER — ONDANSETRON 8 MG PO TBDP: ORAL_TABLET | ORAL | Status: DC

## 2012-12-17 MED ORDER — METOCLOPRAMIDE HCL 5 MG/ML IJ SOLN
10.0000 mg | Freq: Once | INTRAMUSCULAR | Status: AC
  Administered 2012-12-17: 10 mg via INTRAVENOUS
  Filled 2012-12-17: qty 2

## 2012-12-17 MED ORDER — SODIUM CHLORIDE 0.9 % IV BOLUS (SEPSIS)
1000.0000 mL | Freq: Once | INTRAVENOUS | Status: AC
  Administered 2012-12-17: 1000 mL via INTRAVENOUS

## 2012-12-17 MED ORDER — DIPHENHYDRAMINE HCL 50 MG/ML IJ SOLN
25.0000 mg | Freq: Once | INTRAMUSCULAR | Status: AC
  Administered 2012-12-17: 25 mg via INTRAVENOUS
  Filled 2012-12-17: qty 1

## 2012-12-17 NOTE — ED Notes (Signed)
Bed: RESB Expected date:  Expected time:  Means of arrival:  Comments: Triage, possible head bleed sent from PCP

## 2012-12-17 NOTE — ED Provider Notes (Signed)
CSN: [id number]     Arrival date & time 12/17/12  1725 History   First MD Initiated Contact with Patient 12/17/12 1742     Chief Complaint  Patient presents with  . Dizziness  . Emesis   (Consider location/radiation/quality/duration/timing/severity/associated sxs/prior Treatment) HPI Comments: Patient is a 42 year old male with past medical history significant for ADHD, depression, hypertension.  He presents from his doctor's office with complaint of headache that started this morning. He states that the headache is severe and he is nauseated and having multiple episodes of emesis. He denies any fevers or stiff neck. Wife states that he was having difficulty walking and states that he was having difficulty seeing out of both eyes. He says he has never had anything like this before.  Patient is a 42 y.o. male presenting with headaches. The history is provided by the patient.  Headache Pain location:  Generalized Quality:  Sharp Radiates to:  Does not radiate Onset quality:  Sudden Duration:  12 hours Timing:  Constant Progression:  Worsening Chronicity:  New Similar to prior headaches: no   Context: activity and bright light   Relieved by:  Nothing Worsened by:  Nothing tried Ineffective treatments:  None tried Associated symptoms: no abdominal pain, no fever and no sinus pressure     Past Medical History  Diagnosis Date  . Symptomatic cholelithiasis 03/2012  . ADHD (attention deficit hyperactivity disorder)     antidepressants are for ADHD   Past Surgical History  Procedure Laterality Date  . Mandible reconstruction  1992  . Laryngoscopy  11/22/2008  . Cardiac catheterization  11/14/2008  . Cholecystectomy  04/15/2012    Procedure: LAPAROSCOPIC CHOLECYSTECTOMY;  Surgeon: Shelly Rubenstein, MD;  Location: [facility];  Service: General;  Laterality: N/A;   Family History  Problem Relation Age of Onset  . Cancer Maternal Aunt     lung  . Cancer Maternal  Grandmother     lung & non hodgkins  . Cancer Maternal Grandfather     non hodgkins   History  Substance Use Topics  . Smoking status: Former Smoker    Quit date: 03/25/1993  . Smokeless tobacco: Never Used  . Alcohol Use: No    Review of Systems  Constitutional: Negative for fever.  HENT: Negative for sinus pressure.   Gastrointestinal: Negative for abdominal pain.  Neurological: Positive for headaches.  All other systems reviewed and are negative.    Allergies  Morphine sulfate  Home Medications   Current Outpatient Rx  Name  Route  Sig  Dispense  Refill  . amphetamine-dextroamphetamine (ADDERALL) 10 MG tablet   Oral   Take 10 mg by mouth Daily.         . clonazePAM (KLONOPIN) 1 MG tablet   Oral   Take 1 mg by mouth Daily.         Marland Kitchen escitalopram (LEXAPRO) 20 MG tablet   Oral   Take 20 mg by mouth Daily.         Marland Kitchen ibuprofen (ADVIL,MOTRIN) 200 MG tablet   Oral   Take 200 mg by mouth every 6 (six) hours as needed.          BP 118/59  Pulse 64  Temp(Src) 97.9 F (36.6 C) (Oral)  Resp 22  SpO2 100% Physical Exam  Nursing note and vitals reviewed. Constitutional: He appears well-developed and well-nourished.  Patient appears anxious and is somewhat slow respond.  HENT:  Head: Normocephalic and atraumatic.  Mouth/Throat: Oropharynx is  clear and moist.  Eyes: EOM are normal. Pupils are equal, round, and reactive to light.  Neck: Normal range of motion.  Cardiovascular: Normal rate, regular rhythm and normal heart sounds.   No murmur heard. Pulmonary/Chest: Effort normal and breath sounds normal. No respiratory distress. He has no wheezes.  Abdominal: Soft. Bowel sounds are normal.  Musculoskeletal: Normal range of motion.  Neurological: He is alert. No cranial nerve deficit. Coordination normal.  Patient is awake and alert but appears quite anxious. He is able to follow commands however somewhat slow to do so. There is a diminished strength  throughout all 4 extremities, however this appears to be symmetrical.  Skin: Skin is warm and dry.    ED Course  Procedures (including critical care time) Labs Review Labs Reviewed  GLUCOSE, CAPILLARY  BASIC METABOLIC PANEL  CBC WITH DIFFERENTIAL   Imaging Review No results found.   Date: 12/17/2012  Rate: 70  Rhythm: normal sinus rhythm  QRS Axis: normal  Intervals: normal  ST/T Wave abnormalities: normal  Conduction Disutrbances:none  Narrative Interpretation:   Old EKG Reviewed: unchanged    MDM  No diagnosis found. Patient presents here with complaints of headache, dizziness, nausea and vomiting that started this morning. He was seen by his primary doctor and sent here for CT scan to rule out intracranial bleed. This was performed and was unremarkable. Laboratory studies are unremarkable as well and the patient is feeling markedly improved with IV fluids and medications. He was ambulated in the department and was steady on his feet and appears to be back to his baseline. The CT was negative and the patient is back to his baseline so I do not feel as though this represents a subarachnoid hemorrhage. I do not feel as though an LP is indicated in this situation. I will discharge him to home with medication for nausea and followup as needed. I suspect this was some sort of migraine or migraine equivalent. He does report that he some food that was left out earlier this morning and wonders if he may have had some sort of food poisoning.    Geoffery Lyons, MD 12/17/12 2008

## 2012-12-17 NOTE — ED Notes (Signed)
Pt woke up at 730 this morning feeling extremely dizzy and weak. Pt went right back to sleep, then woke up later still feeling dizzy and weak. Pt denies any recent head trauma. A&O x 4. Throbbing headache. Pt denies any similar hx.

## 2013-03-23 IMAGING — CT CT ABD-PELV W/ CM
1 of 4 series · 15 of 36 positions shown, 19 images · IV contrast (READICAT/WATER & [ID] OMNI 300)
Comparison: ultrasound 03/28/2012.  CT chest abdomen pelvis
03/19/2012.

CLINICAL DATA: Right upper quadrant pain.  Cholecystectomy.

CT ABDOMEN AND PELVIS WITH CONTRAST
TECHNIQUE: Multidetector CT imaging of the abdomen and pelvis was
performed following the standard protocol during bolus
administration of intravenous contrast.
Contrast: 125mL OMNIPAQUE IOHEXOL 300 MG/ML  SOLN

[Series 2: abd/pelvis with · axial · 0.78mm/px · z∈[-455,+20]mm · 15 of 103 slices shown, 19 images]
[im 5/103  soft-tissue]
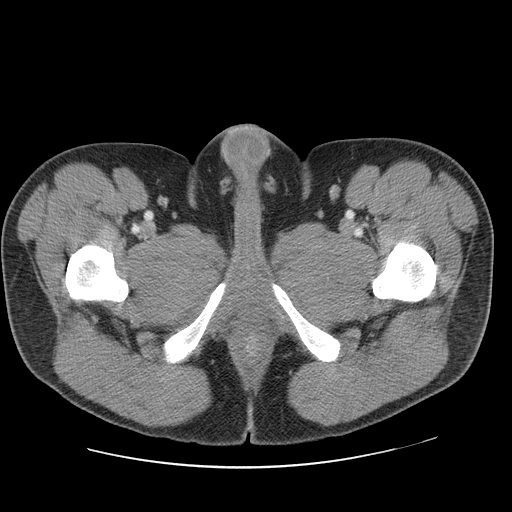
[im 5/103  bone]
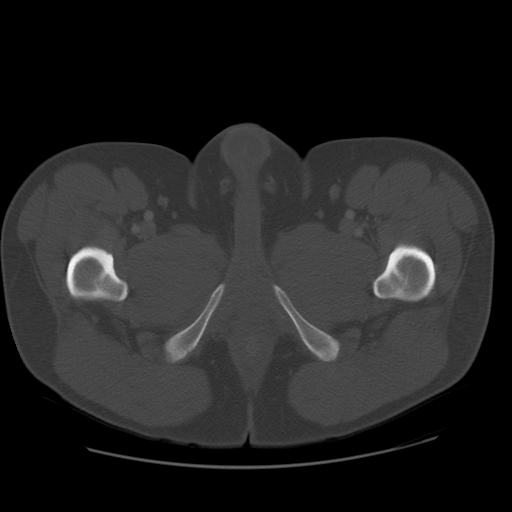
[im 13/103  soft-tissue]
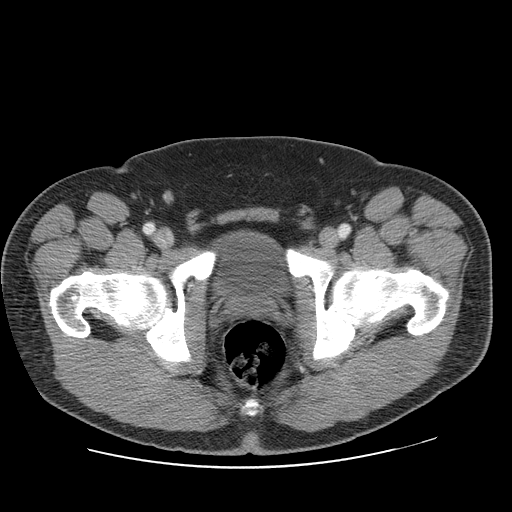
[im 22/103  soft-tissue]
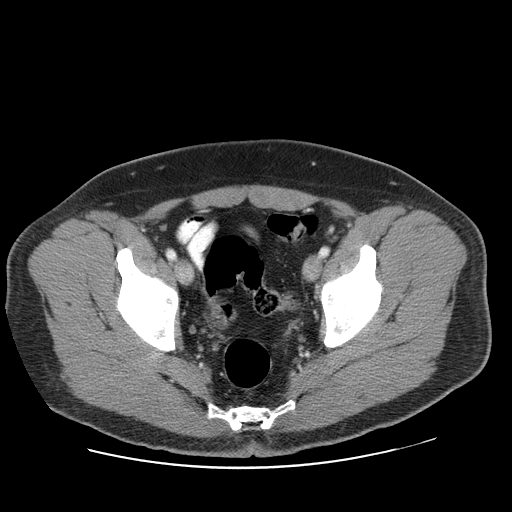
[im 30/103  soft-tissue]
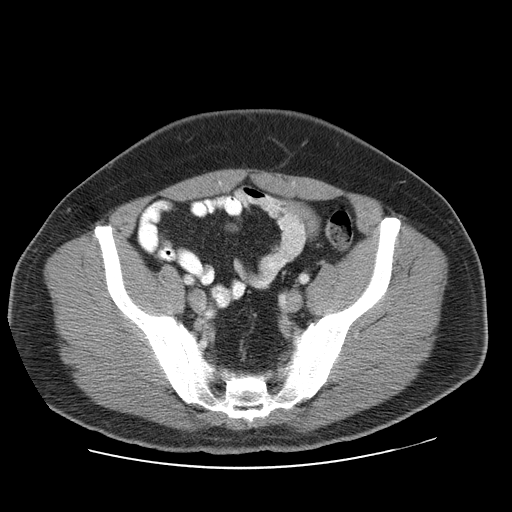
[im 35/103  soft-tissue]
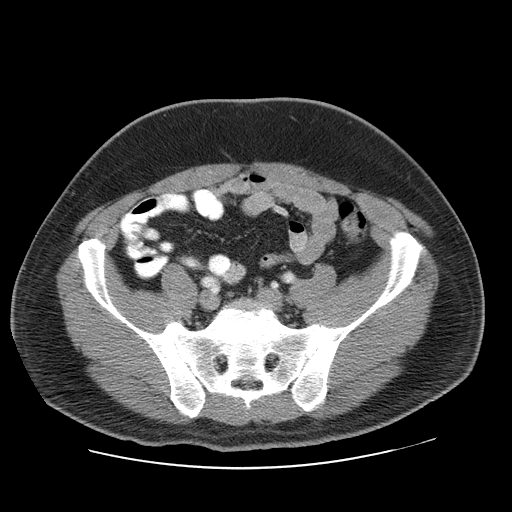
[im 43/103  soft-tissue]
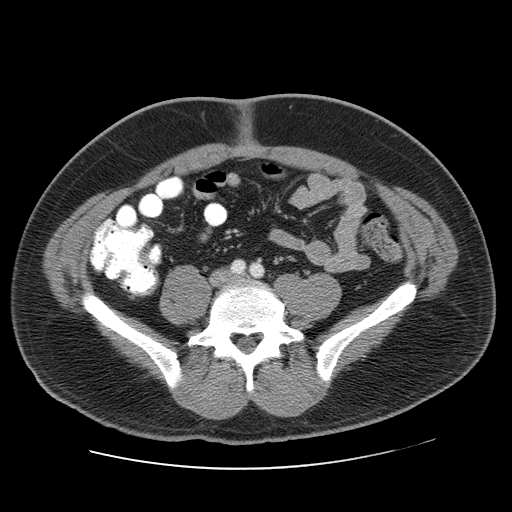
[im 52/103  soft-tissue]
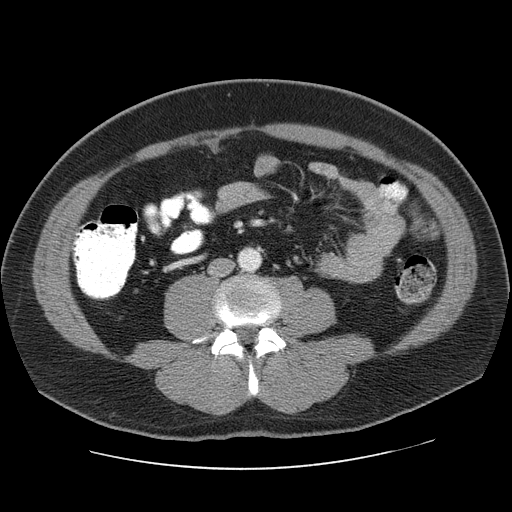
[im 60/103  soft-tissue]
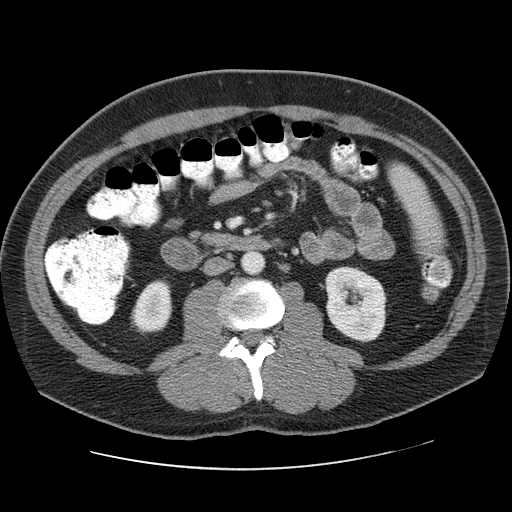
[im 69/103  soft-tissue]
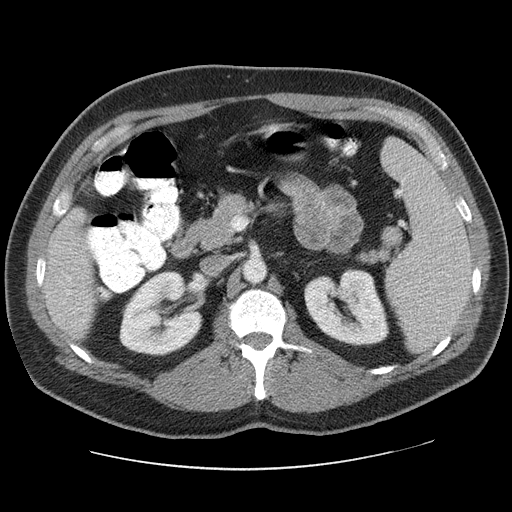
[im 69/103  bone]
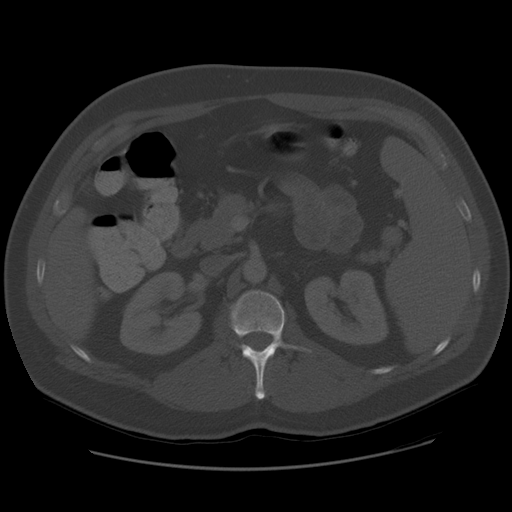
[im 73/103  soft-tissue]
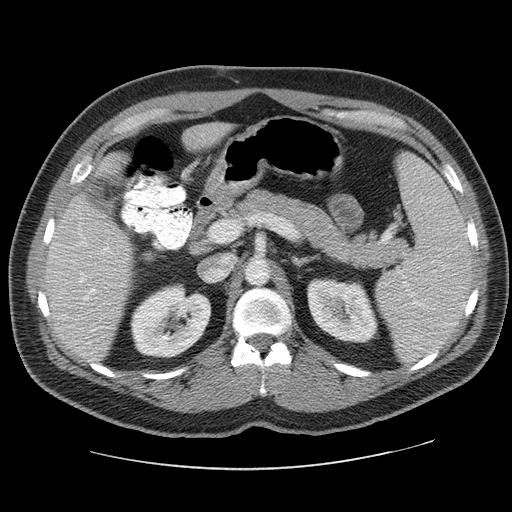
[im 81/103  soft-tissue]
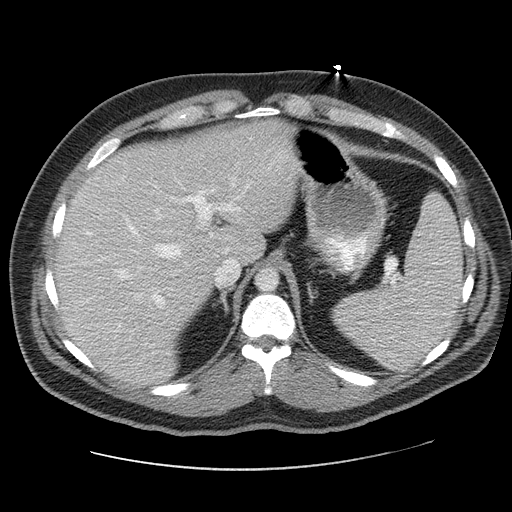
[im 86/103  lung]
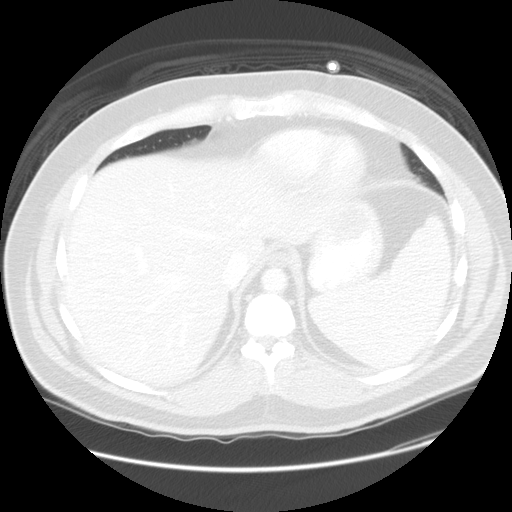
[im 90/103  soft-tissue]
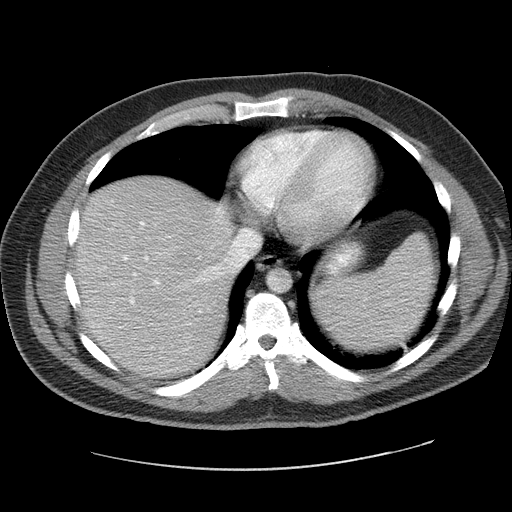
[im 90/103  lung]
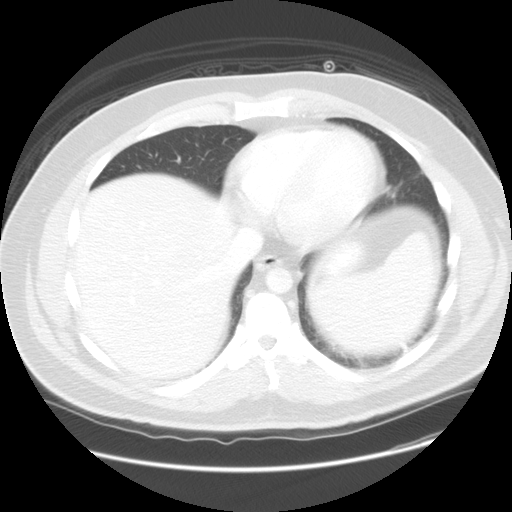
[im 94/103  lung]
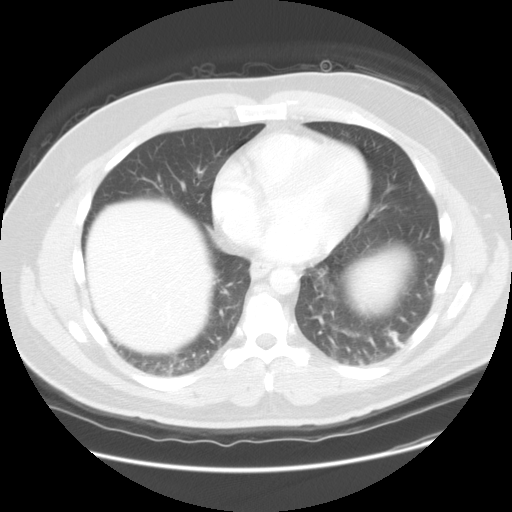
[im 98/103  soft-tissue]
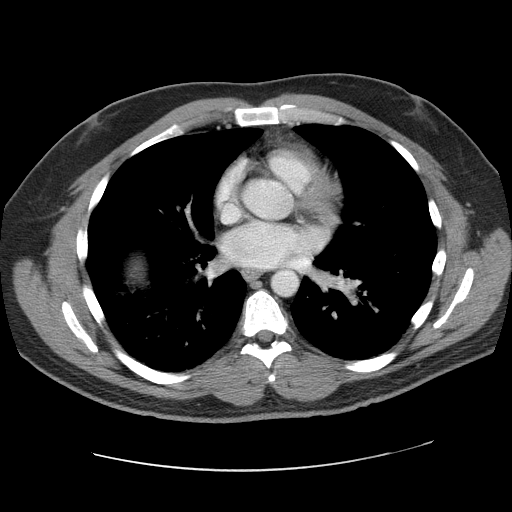
[im 98/103  lung]
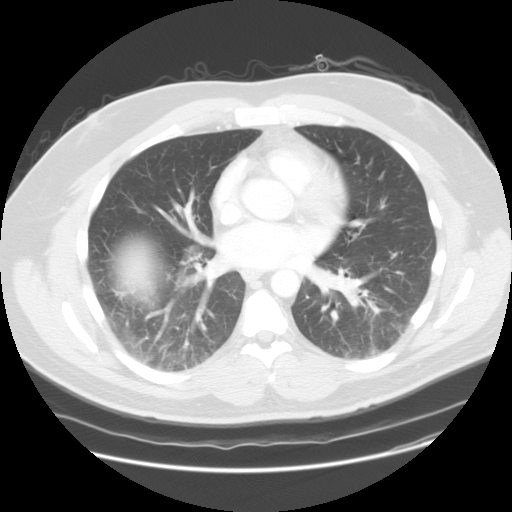

[15 of 36 positions shown; findings below may reference images not displayed]

FINDINGS: Dependent atelectasis the lung bases.

Spleen is enlarged, measuring 17 cm long axis.  There are no mass
lesions in the liver.

There is fluid in the gallbladder fossa status post
cholecystectomy.  Findings suspicious for a biloma in the
gallbladder bed.  Little if any peripheral enhancement is present
and no gas, making the abscess unlikely.  The adrenal glands and
kidneys appear normal.  There is no adenopathy.  Small and large
bowel appear within normal limits.  No free fluid.  Urinary bladder
normal.  Vasculature is within normal limits.  The appendix appears
normal.  No aggressive osseous lesions.
IMPRESSION: 1.  Cholecystectomy with fluid in the gallbladder bed.  The
findings likely represent a biloma from a leak from the duct of
[place].  Abscess is considered less likely.
2.  Nonspecific splenomegaly with 17 cm span.

## 2013-05-29 ENCOUNTER — Emergency Department (HOSPITAL_COMMUNITY)
Admission: EM | Admit: 2013-05-29 | Discharge: 2013-05-29 | Disposition: A | Discharge status: Home / Self Care | Payer: BC Managed Care – PPO | Attending: Emergency Medicine | Admitting: Emergency Medicine

## 2013-05-29 ENCOUNTER — Encounter (HOSPITAL_COMMUNITY): Payer: Self-pay | Admitting: Emergency Medicine

## 2013-05-29 ENCOUNTER — Emergency Department (HOSPITAL_COMMUNITY): Payer: BC Managed Care – PPO

## 2013-05-29 DIAGNOSIS — Z9089 Acquired absence of other organs: Secondary | ICD-10-CM | POA: Insufficient documentation

## 2013-05-29 DIAGNOSIS — F909 Attention-deficit hyperactivity disorder, unspecified type: Secondary | ICD-10-CM | POA: Insufficient documentation

## 2013-05-29 DIAGNOSIS — N200 Calculus of kidney: Secondary | ICD-10-CM | POA: Insufficient documentation

## 2013-05-29 DIAGNOSIS — Z9889 Other specified postprocedural states: Secondary | ICD-10-CM | POA: Insufficient documentation

## 2013-05-29 DIAGNOSIS — Z79899 Other long term (current) drug therapy: Secondary | ICD-10-CM | POA: Insufficient documentation

## 2013-05-29 DIAGNOSIS — Z8719 Personal history of other diseases of the digestive system: Secondary | ICD-10-CM | POA: Insufficient documentation

## 2013-05-29 DIAGNOSIS — Z87891 Personal history of nicotine dependence: Secondary | ICD-10-CM | POA: Insufficient documentation

## 2013-05-29 LAB — URINALYSIS, ROUTINE W REFLEX MICROSCOPIC
Bilirubin Urine: NEGATIVE
Glucose, UA: NEGATIVE mg/dL
KETONES UR: NEGATIVE mg/dL
Leukocytes, UA: NEGATIVE
Nitrite: NEGATIVE
PROTEIN: NEGATIVE mg/dL
Specific Gravity, Urine: 1.022 (ref 1.005–1.030)
UROBILINOGEN UA: 0.2 mg/dL (ref 0.0–1.0)
pH: 5 (ref 5.0–8.0)

## 2013-05-29 LAB — CBC WITH DIFFERENTIAL/PLATELET
BASOS ABS: 0 10*3/uL (ref 0.0–0.1)
Basophils Relative: 0 % (ref 0–1)
Eosinophils Absolute: 0.1 10*3/uL (ref 0.0–0.7)
Eosinophils Relative: 2 % (ref 0–5)
HCT: 40.6 % (ref 39.0–52.0)
Hemoglobin: 13.3 g/dL (ref 13.0–17.0)
LYMPHS ABS: 1.4 10*3/uL (ref 0.7–4.0)
Lymphocytes Relative: 32 % (ref 12–46)
MCH: 20.4 pg — ABNORMAL LOW (ref 26.0–34.0)
MCHC: 32.8 g/dL (ref 30.0–36.0)
MCV: 62.4 fL — AB (ref 78.0–100.0)
MONO ABS: 0.3 10*3/uL (ref 0.1–1.0)
MONOS PCT: 7 % (ref 3–12)
NEUTROS ABS: 2.5 10*3/uL (ref 1.7–7.7)
Neutrophils Relative %: 59 % (ref 43–77)
Platelets: 103 10*3/uL — ABNORMAL LOW (ref 150–400)
RBC: 6.51 MIL/uL — ABNORMAL HIGH (ref 4.22–5.81)
RDW: 15.4 % (ref 11.5–15.5)
WBC: 4.3 10*3/uL (ref 4.0–10.5)

## 2013-05-29 LAB — BASIC METABOLIC PANEL
BUN: 16 mg/dL (ref 6–23)
CALCIUM: 9 mg/dL (ref 8.4–10.5)
CO2: 24 meq/L (ref 19–32)
CREATININE: 1 mg/dL (ref 0.50–1.35)
Chloride: 101 mEq/L (ref 96–112)
GFR calc Af Amer: >90 mL/min (ref >90)
Glucose, Bld: 99 mg/dL (ref 70–99)
Potassium: 3.8 mEq/L (ref 3.7–5.3)
SODIUM: 137 meq/L (ref 137–147)

## 2013-05-29 LAB — URINE MICROSCOPIC-ADD ON

## 2013-05-29 MED ORDER — OXYCODONE-ACETAMINOPHEN 5-325 MG PO TABS: 1.0000 | ORAL_TABLET | ORAL | Status: DC | PRN

## 2013-05-29 MED ORDER — HYDROMORPHONE HCL PF 1 MG/ML IJ SOLN
0.5000 mg | Freq: Once | INTRAMUSCULAR | Status: AC
  Administered 2013-05-29: 0.5 mg via INTRAVENOUS
  Filled 2013-05-29: qty 1

## 2013-05-29 MED ORDER — ONDANSETRON HCL 4 MG PO TABS: 4.0000 mg | ORAL_TABLET | Freq: Four times a day (QID) | ORAL | Status: DC

## 2013-05-29 MED ORDER — HYDROMORPHONE HCL PF 1 MG/ML IJ SOLN
1.0000 mg | Freq: Once | INTRAMUSCULAR | Status: AC
  Administered 2013-05-29: 1 mg via INTRAVENOUS
  Filled 2013-05-29: qty 1

## 2013-05-29 NOTE — ED Notes (Signed)
Patient complains of left flank pain that woke him up from his sleep around 0445.  He did not take any medication to relieve the pain.  He reports pain level 10/10.  He is alert and oriented. Patient reports he has no problems with his kidneys.

## 2013-05-29 NOTE — ED Notes (Signed)
Pt resting quietly at the time. States 4/10 L flank pain. Some relief with medication. No nausea/vomiting. Family at bedside. No signs of distress noted.

## 2013-05-29 NOTE — ED Provider Notes (Signed)
CSN: [id number]     Arrival date & time 05/29/13  0607 History   First MD Initiated Contact with Patient 05/29/13 [phone]     Chief Complaint  Patient presents with  . Flank Pain     (Consider location/radiation/quality/duration/timing/severity/associated sxs/prior Treatment) Patient is a 43 y.o. male presenting with flank pain. The history is provided by the patient and the spouse.  Flank Pain This is a new problem. The current episode started today. Pertinent negatives include no chest pain, chills, coughing, fever, myalgias, nausea or vomiting. Associated symptoms comments: Pain in left flank that woke patient from sleep this morning. No nausea or vomiting. He denies hematuria or history of kidney stones. Pain radiates to LUQ abdomen without groin or LLQ involvement. No fever. No known injury or history of back pain..    Past Medical History  Diagnosis Date  . Symptomatic cholelithiasis 03/2012  . ADHD (attention deficit hyperactivity disorder)     antidepressants are for ADHD   Past Surgical History  Procedure Laterality Date  . Mandible reconstruction  1992  . Laryngoscopy  11/22/2008  . Cardiac catheterization  11/14/2008  . Cholecystectomy  04/15/2012    Procedure: LAPAROSCOPIC CHOLECYSTECTOMY;  Surgeon: Shelly Rubensteinouglas A Blackman, MD;  Location: [facility];  Service: General;  Laterality: N/A;   Family History  Problem Relation Age of Onset  . Cancer Maternal Aunt     lung  . Cancer Maternal Grandmother     lung & non hodgkins  . Cancer Maternal Grandfather     non hodgkins   History  Substance Use Topics  . Smoking status: Former Smoker    Quit date: 03/25/1993  . Smokeless tobacco: Never Used  . Alcohol Use: No    Review of Systems  Constitutional: Negative for fever and chills.  Respiratory: Negative.  Negative for cough and shortness of breath.   Cardiovascular: Negative.  Negative for chest pain.  Gastrointestinal: Negative.  Negative for nausea and  vomiting.       See HPI.  Genitourinary: Positive for flank pain. Negative for dysuria, hematuria and testicular pain.  Musculoskeletal: Negative.  Negative for myalgias.  Skin: Negative.   Neurological: Negative.       Allergies  Morphine sulfate  Home Medications   Current Outpatient Rx  Name  Route  Sig  Dispense  Refill  . amphetamine-dextroamphetamine (ADDERALL) 20 MG tablet   Oral   Take 20 mg by mouth daily.         . ARIPiprazole (ABILIFY) 15 MG tablet   Oral   Take 15 mg by mouth at bedtime.         . clonazePAM (KLONOPIN) 1 MG tablet   Oral   Take 1 mg by mouth Daily.         Marland Kitchen. escitalopram (LEXAPRO) 20 MG tablet   Oral   Take 20 mg by mouth at bedtime.          . traZODone (DESYREL) 50 MG tablet   Oral   Take 50 mg by mouth at bedtime as needed for sleep (takes with lexapro at bedtime, if he wakes up during the night he may take an additional dose).           BP 120/70  Pulse 70  Temp(Src) 97.4 F (36.3 C) (Oral)  Resp 18  Ht 5\' 10"  (1.778 m)  Wt 220 lb (99.791 kg)  BMI 31.57 kg/m2  SpO2 96% Physical Exam  Constitutional: He is oriented to person, place,  and time. He appears well-developed and well-nourished.  He appears mildly uncomfortable, lying still and quiet.  HENT:  Head: Normocephalic.  Neck: Normal range of motion. Neck supple.  Cardiovascular: Normal rate and regular rhythm.   Pulmonary/Chest: Effort normal and breath sounds normal.  Abdominal: Soft. Bowel sounds are normal. There is no tenderness. There is no rebound and no guarding.  Musculoskeletal: Normal range of motion.  Neurological: He is alert and oriented to person, place, and time.  Skin: Skin is warm and dry. No rash noted.  Psychiatric: He has a normal mood and affect.    ED Course  Procedures (including critical care time) Labs Review Labs Reviewed  URINALYSIS, ROUTINE W REFLEX MICROSCOPIC - Abnormal; Notable for the following:    APPearance CLOUDY (*)     Hgb urine dipstick LARGE (*)    All other components within normal limits  CBC WITH DIFFERENTIAL - Abnormal; Notable for the following:    RBC 6.51 (*)    MCV 62.4 (*)    MCH 20.4 (*)    Platelets 103 (*)    All other components within normal limits  BASIC METABOLIC PANEL  URINE MICROSCOPIC-ADD ON   Imaging Review Ct Abdomen Pelvis Wo Contrast  05/29/2013   CLINICAL DATA:  Severe left flank pain.  EXAM: CT ABDOMEN AND PELVIS WITHOUT CONTRAST  TECHNIQUE: Multidetector CT imaging of the abdomen and pelvis was performed following the standard protocol without intravenous contrast.  COMPARISON:  05/14/2012 and 03/19/2012  FINDINGS: Lung bases are clear.  Negative for free air.  Unenhanced CT was performed per clinician order. Lack of IV contrast limits sensitivity and specificity, especially for evaluation of abdominal/pelvic solid viscera.  There is a 3 mm stone in the left posterior bladder. It is unclear if this is stone is free within the bladder lumen or at the left ureterovesical junction. There is mild dilatation of the left renal pelvis and left ureter. There is no evidence for a left ureter stone.  The spleen is mildly enlarged and similar to the previous examination. Normal appearance of the liver. The gallbladder is been removed. Normal appearance of the adrenal glands. Normal appearance of the pancreas. No significant free fluid or lymphadenopathy. Normal appearance of the prostate and seminal vesicles. Normal appearance of the right kidney without stones.  There is a small peritoneal nodule in the right anterior abdomen on image 42. This nodule measures 7 mm and unchanged since 05/14/2012. Prominent right mesenteric lymph node on image 41 is also stable. Again noted is a 1.6 cm low-density structure along the inferior right hepatic lobe. This has minimally changed since 05/14/2012 but new since 03/19/2012. There is also a peritoneal nodule in the anterior abdomen on sequence 2, 48 that measures  1 cm. This nodule was not present in 2013 but unchanged since 05/14/2012. Suspect that these findings around the liver and small peritoneal structures are related to the cholecystectomy. No acute bone abnormality.  IMPRESSION: Mild left hydroureteronephrosis. There is a 3 mm stone in the bladder but it is unclear if this stone is within the bladder lumen or at the left ureterovesical junction.  Small peritoneal nodular structures and small low-density structure along the right hepatic lobe. These are new since the patient had a cholecystectomy but not changed since 05/14/2012. Suspect these are postoperative changes.   Electronically Signed   By: Richarda Overlie M.D.   On: 05/29/2013 08:49     EKG Interpretation None      MDM  Final diagnoses:  None    1. Left kidney stone  CT scan shows 3mm stone on left, ?bladder or UVJ. Pain is now controlled. Feel the patient is stable for discharge home. Return precautions given.    Arnoldo Hooker, PA-C 05/29/13 [phone]

## 2013-05-29 NOTE — ED Notes (Signed)
Pt states 10/10 L sided flank pain at the time. No nausea/vomiting. Transported to CT scan. Family at bedside.

## 2013-05-29 NOTE — ED Notes (Signed)
Pt attempting to void at the time.  

## 2013-05-29 NOTE — ED Provider Notes (Signed)
Medical screening examination/treatment/procedure(s) were performed by non-physician practitioner and as supervising physician I was immediately available for consultation/collaboration.   EKG Interpretation None        Junius ArgyleForrest S Fritzi Scripter, MD 05/29/13 1157

## 2013-05-29 NOTE — Discharge Instructions (Signed)
Kidney Stones  Kidney stones (urolithiasis) are deposits that form inside your kidneys. The intense pain is caused by the stone moving through the urinary tract. When the stone moves, the ureter goes into spasm around the stone. The stone is usually passed in the urine.   CAUSES   · A disorder that makes certain neck glands produce too much parathyroid hormone (primary hyperparathyroidism).  · A buildup of uric acid crystals, similar to gout in your joints.  · Narrowing (stricture) of the ureter.  · A kidney obstruction present at birth (congenital obstruction).  · Previous surgery on the kidney or ureters.  · Numerous kidney infections.  SYMPTOMS   · Feeling sick to your stomach (nauseous).  · Throwing up (vomiting).  · Blood in the urine (hematuria).  · Pain that usually spreads (radiates) to the groin.  · Frequency or urgency of urination.  DIAGNOSIS   · Taking a history and physical exam.  · Blood or urine tests.  · CT scan.  · Occasionally, an examination of the inside of the urinary bladder (cystoscopy) is performed.  TREATMENT   · Observation.  · Increasing your fluid intake.  · Extracorporeal shock wave lithotripsy This is a noninvasive procedure that uses shock waves to break up kidney stones.  · Surgery may be needed if you have severe pain or persistent obstruction. There are various surgical procedures. Most of the procedures are performed with the use of small instruments. Only small incisions are needed to accommodate these instruments, so recovery time is minimized.  The size, location, and chemical composition are all important variables that will determine the proper choice of action for you. Talk to your health care provider to better understand your situation so that you will minimize the risk of injury to yourself and your kidney.   HOME CARE INSTRUCTIONS   · Drink enough water and fluids to keep your urine clear or pale yellow. This will help you to pass the stone or stone fragments.  · Strain  all urine through the provided strainer. Keep all particulate matter and stones for your health care provider to see. The stone causing the pain may be as small as a grain of salt. It is very important to use the strainer each and every time you pass your urine. The collection of your stone will allow your health care provider to analyze it and verify that a stone has actually passed. The stone analysis will often identify what you can do to reduce the incidence of recurrences.  · Only take over-the-counter or prescription medicines for pain, discomfort, or fever as directed by your health care provider.  · Make a follow-up appointment with your health care provider as directed.  · Get follow-up X-rays if required. The absence of pain does not always mean that the stone has passed. It may have only stopped moving. If the urine remains completely obstructed, it can cause loss of kidney function or even complete destruction of the kidney. It is your responsibility to make sure X-rays and follow-ups are completed. Ultrasounds of the kidney can show blockages and the status of the kidney. Ultrasounds are not associated with any radiation and can be performed easily in a matter of minutes.  SEEK MEDICAL CARE IF:  · You experience pain that is progressive and unresponsive to any pain medicine you have been prescribed.  SEEK IMMEDIATE MEDICAL CARE IF:   · Pain cannot be controlled with the prescribed medicine.  · You have a fever   or shaking chills.  · The severity or intensity of pain increases over 18 hours and is not relieved by pain medicine.  · You develop a new onset of abdominal pain.  · You feel faint or pass out.  · You are unable to urinate.  MAKE SURE YOU:   · Understand these instructions.  · Will watch your condition.  · Will get help right away if you are not doing well or get worse.  Document Released: 03/11/2005 Document Revised: 11/11/2012 Document Reviewed: 08/12/2012  ExitCare® Patient Information ©2014  [employer].

## 2013-05-29 NOTE — ED Notes (Signed)
8/10 pain to L flank at the time. Vital signs stable. Pt and family updated on plan of care.

## 2013-06-18 ENCOUNTER — Emergency Department (HOSPITAL_COMMUNITY): Payer: BC Managed Care – PPO

## 2013-06-18 ENCOUNTER — Emergency Department (HOSPITAL_COMMUNITY)
Admission: EM | Admit: 2013-06-18 | Discharge: 2013-06-18 | Disposition: A | Discharge status: Home / Self Care | Payer: BC Managed Care – PPO | Attending: Emergency Medicine | Admitting: Emergency Medicine

## 2013-06-18 DIAGNOSIS — F909 Attention-deficit hyperactivity disorder, unspecified type: Secondary | ICD-10-CM | POA: Insufficient documentation

## 2013-06-18 DIAGNOSIS — Z87891 Personal history of nicotine dependence: Secondary | ICD-10-CM | POA: Insufficient documentation

## 2013-06-18 DIAGNOSIS — S0990XA Unspecified injury of head, initial encounter: Secondary | ICD-10-CM

## 2013-06-18 DIAGNOSIS — Z9889 Other specified postprocedural states: Secondary | ICD-10-CM | POA: Insufficient documentation

## 2013-06-18 DIAGNOSIS — S060XAA Concussion with loss of consciousness status unknown, initial encounter: Secondary | ICD-10-CM

## 2013-06-18 DIAGNOSIS — Y9389 Activity, other specified: Secondary | ICD-10-CM | POA: Insufficient documentation

## 2013-06-18 DIAGNOSIS — Y92410 Unspecified street and highway as the place of occurrence of the external cause: Secondary | ICD-10-CM | POA: Insufficient documentation

## 2013-06-18 DIAGNOSIS — Z79899 Other long term (current) drug therapy: Secondary | ICD-10-CM | POA: Insufficient documentation

## 2013-06-18 DIAGNOSIS — S060X9A Concussion with loss of consciousness of unspecified duration, initial encounter: Secondary | ICD-10-CM

## 2013-06-18 DIAGNOSIS — S060X0A Concussion without loss of consciousness, initial encounter: Secondary | ICD-10-CM | POA: Insufficient documentation

## 2013-06-18 MED ORDER — TRAMADOL HCL 50 MG PO TABS: 50.0000 mg | ORAL_TABLET | Freq: Four times a day (QID) | ORAL | Status: DC | PRN
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
## 2013-06-18 NOTE — Discharge Instructions (Signed)

## 2013-06-18 NOTE — ED Notes (Signed)
Patient transported to CT 

## 2014-12-09 ENCOUNTER — Emergency Department (INDEPENDENT_AMBULATORY_CARE_PROVIDER_SITE_OTHER)
Admission: EM | Admit: 2014-12-09 | Discharge: 2014-12-09 | Disposition: A | Discharge status: Home / Self Care | Payer: BLUE CROSS/BLUE SHIELD | Source: Home / Self Care | Attending: Family Medicine | Admitting: Family Medicine

## 2014-12-09 ENCOUNTER — Encounter (HOSPITAL_COMMUNITY): Payer: Self-pay | Admitting: *Deleted

## 2014-12-09 DIAGNOSIS — S39012A Strain of muscle, fascia and tendon of lower back, initial encounter: Secondary | ICD-10-CM

## 2014-12-09 MED ORDER — KETOROLAC TROMETHAMINE 60 MG/2ML IM SOLN
60.0000 mg | Freq: Once | INTRAMUSCULAR | Status: AC
  Administered 2014-12-09: 60 mg via INTRAMUSCULAR

## 2014-12-09 MED ORDER — KETOROLAC TROMETHAMINE 60 MG/2ML IM SOLN
INTRAMUSCULAR | Status: AC
  Filled 2014-12-09: qty 2

## 2014-12-09 MED ORDER — CYCLOBENZAPRINE HCL 5 MG PO TABS: 5.0000 mg | ORAL_TABLET | Freq: Three times a day (TID) | ORAL | Status: DC | PRN

## 2014-12-09 MED ORDER — DICLOFENAC POTASSIUM 50 MG PO TABS: 50.0000 mg | ORAL_TABLET | Freq: Three times a day (TID) | ORAL | Status: DC

## 2014-12-09 NOTE — ED Provider Notes (Signed)
CSN: [id number]     Arrival date & time 12/09/14  1845 History   First MD Initiated Contact with Patient 12/09/14 1927     Chief Complaint  Patient presents with  . Back Pain   (Consider location/radiation/quality/duration/timing/severity/associated sxs/prior Treatment) Patient is a 44 y.o. male presenting with back pain. The history is provided by the patient and a parent.  Back Pain Location:  Lumbar spine Quality:  Stabbing and stiffness Radiates to:  L posterior upper leg Pain severity:  Moderate Onset quality:  Sudden Duration:  1 day Progression:  Worsening Chronicity:  New Context: lifting heavy objects   Context comment:  Works at [employer]. ot for past 15wks, yest 5;30 am to 7pm at night and developed back pain Relieved by:  None tried Worsened by:  Nothing tried Associated symptoms: no numbness and no weakness     Past Medical History  Diagnosis Date  . Symptomatic cholelithiasis 03/2012  . ADHD (attention deficit hyperactivity disorder)     antidepressants are for ADHD  . Hypertension   . Depression   . Thalassemia minor   . Leukopenia   . Hyperlipidemia   . Low HDL (under 40)   . Gout    Past Surgical History  Procedure Laterality Date  . Mandible reconstruction  1992  . Laryngoscopy  11/22/2008  . Cardiac catheterization  11/14/2008  . Cholecystectomy  04/15/2012    Procedure: LAPAROSCOPIC CHOLECYSTECTOMY;  Surgeon: Shelly Rubenstein, MD;  Location: [facility];  Service: General;  Laterality: N/A;   Family History  Problem Relation Age of Onset  . Cancer Maternal Aunt     lung  . Cancer Maternal Grandmother     lung & non hodgkins  . Cancer Maternal Grandfather     non hodgkins   Social History  Substance Use Topics  . Smoking status: Former Smoker    Quit date: 03/25/1993  . Smokeless tobacco: Never Used  . Alcohol Use: No    Review of Systems  Gastrointestinal: Negative.   Genitourinary: Negative.   Musculoskeletal: Positive  for myalgias, back pain and gait problem. Negative for joint swelling.  Neurological: Negative for weakness and numbness.  All other systems reviewed and are negative.   Allergies  Morphine sulfate  Home Medications   Prior to Admission medications   Medication Sig Start Date End Date Taking? Authorizing Provider  amphetamine-dextroamphetamine (ADDERALL) 20 MG tablet Take 20 mg by mouth daily.    Historical Provider, MD  ARIPiprazole (ABILIFY) 5 MG tablet Take 5 mg by mouth at bedtime.    Historical Provider, MD  clonazePAM (KLONOPIN) 1 MG tablet Take 1 mg by mouth 2 (two) times daily.  01/28/12   Historical Provider, MD  cyclobenzaprine (FLEXERIL) 5 MG tablet Take 1 tablet (5 mg total) by mouth 3 (three) times daily as needed for muscle spasms. 12/09/14   Linna Hoff, MD  diclofenac (CATAFLAM) 50 MG tablet Take 1 tablet (50 mg total) by mouth 3 (three) times daily. For back pain 12/09/14   Linna Hoff, MD  escitalopram (LEXAPRO) 20 MG tablet Take 20 mg by mouth at bedtime.  01/28/12   Historical Provider, MD  traMADol (ULTRAM) 50 MG tablet Take 1 tablet (50 mg total) by mouth every 6 (six) hours as needed. 06/18/13   Gilda Crease, MD  traZODone (DESYREL) 50 MG tablet Take 50 mg by mouth at bedtime.  05/19/13   Historical Provider, MD   Meds Ordered and Administered this Visit  Medications  ketorolac (TORADOL) injection 60 mg (not administered)    BP 114/76 mmHg  Pulse 124  Temp(Src) 98.2 F (36.8 C) (Oral)  Resp 18  SpO2 97% No data found.   Physical Exam  Constitutional: He is oriented to person, place, and time. He appears well-developed and well-nourished. He appears distressed.  Eyes: Pupils are equal, round, and reactive to light.  Neck: Normal range of motion. Neck supple.  Abdominal: Soft. Bowel sounds are normal. There is no tenderness.  Musculoskeletal: He exhibits tenderness.       Lumbar back: He exhibits decreased range of motion, tenderness, bony  tenderness, pain and spasm. He exhibits no swelling, no edema, no deformity and normal pulse.       Back:  Neurological: He is alert and oriented to person, place, and time.  Skin: Skin is warm and dry.  Nursing note and vitals reviewed.   ED Course  Procedures (including critical care time)  Labs Review Labs Reviewed - No data to display  Imaging Review No results found.   Visual Acuity Review  Right Eye Distance:   Left Eye Distance:   Bilateral Distance:    Right Eye Near:   Left Eye Near:    Bilateral Near:         MDM   1. Strain of lumbar paraspinal muscle, initial encounter      Linna Hoff, MD 12/09/14 [phone]

## 2014-12-09 NOTE — ED Notes (Signed)
Pt  Reports  Does    Lifting       Heavy      Objects  Recently     Pain      Is   Mainly  In  Back radiates  To  Front   Pt  Ambulates   With          A  Slow steady  Gait        Denys  Any       Urinary  Symptoms

## 2014-12-16 ENCOUNTER — Other Ambulatory Visit: Payer: Self-pay | Admitting: Family Medicine

## 2014-12-16 DIAGNOSIS — T148XXA Other injury of unspecified body region, initial encounter: Secondary | ICD-10-CM

## 2014-12-19 ENCOUNTER — Ambulatory Visit
Admission: RE | Admit: 2014-12-19 | Discharge: 2014-12-19 | Disposition: A | Discharge status: Home / Self Care | Payer: BLUE CROSS/BLUE SHIELD | Source: Ambulatory Visit | Attending: Family Medicine | Admitting: Family Medicine

## 2014-12-19 DIAGNOSIS — T148XXA Other injury of unspecified body region, initial encounter: Secondary | ICD-10-CM

## 2015-09-25 ENCOUNTER — Telehealth: Payer: Self-pay | Admitting: Internal Medicine

## 2015-09-25 NOTE — Telephone Encounter (Signed)
Received records from [facility] for appointment on 11/01/15 with Dr Rennis GoldenHilty.  [facility] for Dr Blanchie DessertHilty's schedule on 11/01/15. lp

## 2015-11-01 ENCOUNTER — Encounter: Payer: Self-pay | Admitting: Internal Medicine

## 2015-11-01 ENCOUNTER — Ambulatory Visit (INDEPENDENT_AMBULATORY_CARE_PROVIDER_SITE_OTHER): Payer: BLUE CROSS/BLUE SHIELD | Admitting: Internal Medicine

## 2015-11-01 VITALS — BP 145/93 | HR 100 | Ht 69.0 in | Wt 238.6 lb

## 2015-11-01 DIAGNOSIS — R5383 Other fatigue: Secondary | ICD-10-CM | POA: Diagnosis not present

## 2015-11-01 DIAGNOSIS — I10 Essential (primary) hypertension: Secondary | ICD-10-CM

## 2015-11-01 DIAGNOSIS — Z01812 Encounter for preprocedural laboratory examination: Secondary | ICD-10-CM

## 2015-11-01 DIAGNOSIS — E785 Hyperlipidemia, unspecified: Secondary | ICD-10-CM

## 2015-11-01 DIAGNOSIS — D689 Coagulation defect, unspecified: Secondary | ICD-10-CM | POA: Diagnosis not present

## 2015-11-01 DIAGNOSIS — I200 Unstable angina: Secondary | ICD-10-CM | POA: Diagnosis not present

## 2015-11-01 DIAGNOSIS — Z01818 Encounter for other preprocedural examination: Secondary | ICD-10-CM

## 2015-11-01 NOTE — Patient Instructions (Signed)
Your physician has requested that you have a cardiac catheterization @ [facility] within the next 1-2 weeks w/Dr. Excell Seltzerooper (preferably). Cardiac catheterization is used to diagnose and/or treat various heart conditions. Doctors may recommend this procedure for a number of different reasons. The most common reason is to evaluate chest pain. Chest pain can be a symptom of coronary artery disease (CAD), and cardiac catheterization can show whether plaque is narrowing or blocking your heart's arteries. This procedure is also used to evaluate the valves, as well as measure the blood flow and oxygen levels in different parts of your heart. For further information please visit [email or web address] Please follow instruction sheet, as given.  Following your catheterization, you will not be allowed to drive for 3 days.  No lifting, pushing, or pulling greater that 10 pounds is allowed for 1 week.  You will be required to have the following tests prior to the procedure:  1. Blood work - the blood work can be done no more than 14 days prior to the procedure.  It can be done at any [facility] lab. There is a lab downstairs on the first floor of this building in [address] and one at [address].  2. Chest X-ray - this can be done at [facility] in the [facility] @ [address]

## 2015-11-01 NOTE — Progress Notes (Signed)
OFFICE NOTE  Chief Complaint:  Chest pain  Primary Care Physician: Emeterio Reeve, MD  HPI:  Cody Medina is a 45 y.o. male who has a past medical history significant for anxiety, depression, ADHD, hypertension (but not on medication), dyslipidemia and GERD. He reports over the past month and a half he's had chest discomfort. He saw his primary care provider and was placed on aspirin. Described as central chest pressure which radiates to his left arm. He's also had central chest wall tenderness which is exquisitely tender to the touch. He also reports numbness and tingling in his left fingers. The left arm heaviness and pain is always associated with chest discomfort. He reports he thinks it's been getting worse over the past month. He's also had some progressive sweating and shortness of breath. Interestingly, he had a stress test in 2006 which was negative for ischemia and a chronic catheterization in 2010 at the age of 9 with Dr. Eldridge Dace, which showed no obstructive CAD.   PMHx:  Past Medical History:  Diagnosis Date  . ADHD (attention deficit hyperactivity disorder)    antidepressants are for ADHD  . Chest pain   . Depression   . Gout   . Hyperlipidemia   . Hypertension   . Leukopenia   . Low HDL (under 40)   . Symptomatic cholelithiasis 03/2012  . Thalassemia minor     Past Surgical History:  Procedure Laterality Date  . CARDIAC CATHETERIZATION  11/14/2008  . CHOLECYSTECTOMY  04/15/2012   Procedure: LAPAROSCOPIC CHOLECYSTECTOMY;  Surgeon: Shelly Rubenstein, MD;  Location: [facility];  Service: General;  Laterality: N/A;  . LARYNGOSCOPY  11/22/2008  . MANDIBLE RECONSTRUCTION  1992    FAMHx:  Family History  Problem Relation Age of Onset  . Osteoporosis Mother   . Hypertension Mother   . Post-traumatic stress disorder Father   . Cancer Maternal Aunt     lung  . Cancer Maternal Grandmother     lung & non hodgkins  . Cancer Maternal  Grandfather     non hodgkins  . Cancer Paternal Aunt   . Heart failure Paternal Grandfather     SOCHx:   reports that he quit smoking about 22 years ago. He has never used smokeless tobacco. He reports that he does not drink alcohol or use drugs.  ALLERGIES:  Allergies  Allergen Reactions  . Morphine Sulfate Nausea And Vomiting    ROS: Pertinent items noted in HPI and remainder of comprehensive ROS otherwise negative.  HOME MEDS: Current Outpatient Prescriptions  Medication Sig Dispense Refill  . amphetamine-dextroamphetamine (ADDERALL) 20 MG tablet Take 20 mg by mouth daily.    Marland Kitchen aspirin 81 MG tablet Take 81 mg by mouth daily.    . clonazePAM (KLONOPIN) 1 MG tablet Take 1 mg by mouth 2 (two) times daily.     . DULoxetine (CYMBALTA) 60 MG capsule Take 60 mg by mouth daily.    . naproxen sodium (ALEVE) 220 MG tablet Take 220 mg by mouth as needed.    . zaleplon (SONATA) 10 MG capsule Take 10 mg by mouth at bedtime.     No current facility-administered medications for this visit.     LABS/IMAGING: No results found for this or any previous visit (from the past 48 hour(s)). No results found.  WEIGHTS: Wt Readings from Last 3 Encounters:  11/01/15 238 lb 9.6 oz (108.2 kg)  05/29/13 220 lb (99.8 kg)  05/13/12 216 lb (98 kg)  VITALS: BP (!) 145/93   Pulse 100   Ht 5\' 9"  (1.753 m)   Wt 238 lb 9.6 oz (108.2 kg)   BMI 35.24 kg/m   EXAM: General appearance: alert and no distress Neck: no carotid bruit and no JVD Lungs: clear to auscultation bilaterally Heart: regular tachycardia, no murmur Abdomen: soft, non-tender; bowel sounds normal; no masses,  no organomegaly and obese Extremities: extremities normal, atraumatic, no cyanosis or edema Pulses: 2+ and symmetric Skin: Skin color, texture, turgor normal. No rashes or lesions Neurologic: Grossly normal Psych: Anxious, poor attention  EKG: Normal sinus rhythm at 100  ASSESSMENT: 1. Chest pain with typical and  atypical features - somewhat concerning for unstable angina 2. Concomitant chest wall pain which is reproducible - likely neuropathic 3. Dyslipidemia 4. Hypertension 5. Family history of heart disease  PLAN: 1.   Cody Medina has described worsening chest pain over the past month and a half radiates to his left arm and is described as a pressure or heaviness that is worse with exertion and relieved by rest. This is descriptive of unstable angina. He also has exquisite left chest wall tenderness to palpation. This is very atypical for angina. He did have heart catheterization in 2010 which indicated normal coronary arteries. He does have cardiac risk factors including hypertension but has not been treated for this, as well as dyslipidemia. Based on his progressive and worsening symptoms I would recommend repeat heart catheterization. I did discuss risks, benefits and alternatives to car catheterization today with him in detail and he is agreeable to proceed. We'll try to arrange that as soon as possible. If the cardiac catheterization is unrevealing, then he should have further workup for chest wall pain, possibly MRI of the spine to look for a neuropathic pain source.  Thanks for the kind referral.  Chrystie NoseKenneth C. Hilty, MD, [facility] Attending Cardiologist [facility]  Chrystie NoseKenneth C Hilty 11/01/2015, 4:46 PM

## 2015-11-02 ENCOUNTER — Encounter: Payer: Self-pay | Admitting: Internal Medicine

## 2015-11-03 ENCOUNTER — Other Ambulatory Visit: Payer: Self-pay | Admitting: *Deleted

## 2015-11-03 DIAGNOSIS — I200 Unstable angina: Secondary | ICD-10-CM

## 2015-11-06 ENCOUNTER — Telehealth: Payer: Self-pay | Admitting: Internal Medicine

## 2015-11-06 NOTE — Telephone Encounter (Signed)
Patient having cath on August 23.  He needs statement for work stating when surgery is, how long he will be out if he has stent, and/or doesn't have stent, lifting limitations, etc. 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

## 2015-11-08 ENCOUNTER — Ambulatory Visit
Admission: RE | Admit: 2015-11-08 | Discharge: 2015-11-08 | Disposition: A | Discharge status: Home / Self Care | Payer: BLUE CROSS/BLUE SHIELD | Source: Ambulatory Visit | Attending: Internal Medicine | Admitting: Internal Medicine

## 2015-11-08 DIAGNOSIS — Z01818 Encounter for other preprocedural examination: Secondary | ICD-10-CM

## 2015-11-08 NOTE — Telephone Encounter (Signed)
Message routed to MD to assist w/post cath (possible) restrictions There were no work restrictions prior to cath, discussed at last visit.

## 2015-11-09 ENCOUNTER — Encounter: Payer: Self-pay | Admitting: Internal Medicine

## 2015-11-09 LAB — BASIC METABOLIC PANEL
BUN: 12 mg/dL (ref 7–25)
CHLORIDE: 105 mmol/L (ref 98–110)
CO2: 22 mmol/L (ref 20–31)
CREATININE: 1.02 mg/dL (ref 0.60–1.35)
Calcium: 8.8 mg/dL (ref 8.6–10.3)
GLUCOSE: 95 mg/dL (ref 65–99)
Potassium: 4 mmol/L (ref 3.5–5.3)
Sodium: 139 mmol/L (ref 135–146)

## 2015-11-09 LAB — TSH: TSH: 1.34 mIU/L (ref 0.40–4.50)

## 2015-11-09 LAB — CBC
HCT: 38.8 % (ref 38.5–50.0)
Hemoglobin: 12.8 g/dL — ABNORMAL LOW (ref 13.2–17.1)
MCH: 19.8 pg — AB (ref 27.0–33.0)
MCHC: 33 g/dL (ref 32.0–36.0)
MCV: 60.1 fL — AB (ref 80.0–100.0)
PLATELETS: 139 10*3/uL — AB (ref 140–400)
RBC: 6.46 MIL/uL — ABNORMAL HIGH (ref 4.20–5.80)
RDW: 16.9 % — AB (ref 11.0–15.0)
WBC: 4.2 10*3/uL (ref 3.8–10.8)

## 2015-11-09 LAB — PROTIME-INR
INR: 1
Prothrombin Time: 11 s (ref 9.0–11.5)

## 2015-11-09 LAB — APTT: aPTT: 30 s (ref 22–34)

## 2015-11-09 NOTE — Telephone Encounter (Signed)
Returned call to patient. He is aware of labs/CXR results.   He states his work just needs a letter stating what date the procedure is set for, in order to get his (short term?) disability process started in the event he needs to be out of work.   Letter composed/faxed along with cath info, as patient states he was not notified of the information regarding where to go for his cath on 8/23, etc

## 2015-11-09 NOTE — Telephone Encounter (Signed)
follow up   Pt verbalized that he is returning call for rn

## 2015-11-09 NOTE — Telephone Encounter (Signed)
Cody Medina is on 8/23, cannot predict if he will need a stent or not. If he does, will likely be out for 2 weeks, no lifting >10 lbs.  Dr. HRexene Edison

## 2015-11-09 NOTE — Telephone Encounter (Signed)
LMTCB

## 2015-11-14 DIAGNOSIS — I200 Unstable angina: Secondary | ICD-10-CM

## 2015-11-15 ENCOUNTER — Ambulatory Visit (HOSPITAL_COMMUNITY)
Admission: RE | Admit: 2015-11-15 | Discharge: 2015-11-15 | Disposition: A | Discharge status: Home / Self Care | Payer: BLUE CROSS/BLUE SHIELD | Source: Ambulatory Visit | Attending: Cardiovascular Disease | Admitting: Cardiovascular Disease

## 2015-11-15 ENCOUNTER — Ambulatory Visit (HOSPITAL_COMMUNITY)
Admission: RE | Disposition: A | Discharge status: Home / Self Care | Payer: Self-pay | Source: Ambulatory Visit | Attending: Cardiovascular Disease

## 2015-11-15 DIAGNOSIS — Z7982 Long term (current) use of aspirin: Secondary | ICD-10-CM | POA: Insufficient documentation

## 2015-11-15 DIAGNOSIS — I25110 Atherosclerotic heart disease of native coronary artery with unstable angina pectoris: Secondary | ICD-10-CM | POA: Diagnosis not present

## 2015-11-15 DIAGNOSIS — E785 Hyperlipidemia, unspecified: Secondary | ICD-10-CM | POA: Insufficient documentation

## 2015-11-15 DIAGNOSIS — D72819 Decreased white blood cell count, unspecified: Secondary | ICD-10-CM | POA: Insufficient documentation

## 2015-11-15 DIAGNOSIS — Z882 Allergy status to sulfonamides status: Secondary | ICD-10-CM | POA: Diagnosis not present

## 2015-11-15 DIAGNOSIS — I10 Essential (primary) hypertension: Secondary | ICD-10-CM | POA: Diagnosis not present

## 2015-11-15 DIAGNOSIS — F419 Anxiety disorder, unspecified: Secondary | ICD-10-CM | POA: Diagnosis not present

## 2015-11-15 DIAGNOSIS — Z801 Family history of malignant neoplasm of trachea, bronchus and lung: Secondary | ICD-10-CM | POA: Insufficient documentation

## 2015-11-15 DIAGNOSIS — F329 Major depressive disorder, single episode, unspecified: Secondary | ICD-10-CM | POA: Insufficient documentation

## 2015-11-15 DIAGNOSIS — R079 Chest pain, unspecified: Secondary | ICD-10-CM | POA: Diagnosis present

## 2015-11-15 DIAGNOSIS — Z87891 Personal history of nicotine dependence: Secondary | ICD-10-CM | POA: Insufficient documentation

## 2015-11-15 DIAGNOSIS — F909 Attention-deficit hyperactivity disorder, unspecified type: Secondary | ICD-10-CM | POA: Diagnosis not present

## 2015-11-15 DIAGNOSIS — M109 Gout, unspecified: Secondary | ICD-10-CM | POA: Diagnosis not present

## 2015-11-15 DIAGNOSIS — K219 Gastro-esophageal reflux disease without esophagitis: Secondary | ICD-10-CM | POA: Insufficient documentation

## 2015-11-15 DIAGNOSIS — I200 Unstable angina: Secondary | ICD-10-CM

## 2015-11-15 DIAGNOSIS — Z8249 Family history of ischemic heart disease and other diseases of the circulatory system: Secondary | ICD-10-CM | POA: Insufficient documentation

## 2015-11-15 HISTORY — PX: CARDIAC CATHETERIZATION: SHX172

## 2015-11-15 SURGERY — LEFT HEART CATH AND CORONARY ANGIOGRAPHY
Anesthesia: LOCAL

## 2015-11-15 MED ORDER — SODIUM CHLORIDE 0.9 % IV SOLN
INTRAVENOUS | Status: DC
  Administered 2015-11-15: 09:00:00 via INTRAVENOUS

## 2015-11-15 MED ORDER — LIDOCAINE HCL (PF) 1 % IJ SOLN
INTRAMUSCULAR | Status: AC
  Filled 2015-11-15: qty 30

## 2015-11-15 MED ORDER — ASPIRIN 81 MG PO CHEW
81.0000 mg | CHEWABLE_TABLET | ORAL | Status: AC
  Administered 2015-11-15: 81 mg via ORAL

## 2015-11-15 MED ORDER — SODIUM CHLORIDE 0.9% FLUSH: 3.0000 mL | Freq: Two times a day (BID) | INTRAVENOUS | Status: DC

## 2015-11-15 MED ORDER — FENTANYL CITRATE (PF) 100 MCG/2ML IJ SOLN
INTRAMUSCULAR | Status: AC
  Filled 2015-11-15: qty 2

## 2015-11-15 MED ORDER — SODIUM CHLORIDE 0.9 % IV SOLN: 250.0000 mL | INTRAVENOUS | Status: DC | PRN

## 2015-11-15 MED ORDER — HEPARIN (PORCINE) IN NACL 2-0.9 UNIT/ML-% IJ SOLN
INTRAMUSCULAR | Status: DC | PRN
  Administered 2015-11-15: 10 mL via INTRA_ARTERIAL

## 2015-11-15 MED ORDER — HEPARIN (PORCINE) IN NACL 2-0.9 UNIT/ML-% IJ SOLN
INTRAMUSCULAR | Status: AC
  Filled 2015-11-15: qty 1000

## 2015-11-15 MED ORDER — ACETAMINOPHEN 325 MG PO TABS: 650.0000 mg | ORAL_TABLET | ORAL | Status: DC | PRN

## 2015-11-15 MED ORDER — SODIUM CHLORIDE 0.9 % WEIGHT BASED INFUSION: 1.0000 mL/kg/h | INTRAVENOUS | Status: DC

## 2015-11-15 MED ORDER — IOPAMIDOL (ISOVUE-370) INJECTION 76%
INTRAVENOUS | Status: AC
  Filled 2015-11-15: qty 100

## 2015-11-15 MED ORDER — SODIUM CHLORIDE 0.9% FLUSH: 3.0000 mL | INTRAVENOUS | Status: DC | PRN

## 2015-11-15 MED ORDER — ONDANSETRON HCL 4 MG/2ML IJ SOLN: 4.0000 mg | Freq: Four times a day (QID) | INTRAMUSCULAR | Status: DC | PRN

## 2015-11-15 MED ORDER — HEPARIN (PORCINE) IN NACL 2-0.9 UNIT/ML-% IJ SOLN
INTRAMUSCULAR | Status: AC
  Filled 2015-11-15: qty 500

## 2015-11-15 MED ORDER — VERAPAMIL HCL 2.5 MG/ML IV SOLN
INTRAVENOUS | Status: AC
  Filled 2015-11-15: qty 2

## 2015-11-15 MED ORDER — MIDAZOLAM HCL 2 MG/2ML IJ SOLN
INTRAMUSCULAR | Status: AC
  Filled 2015-11-15: qty 2

## 2015-11-15 MED ORDER — HEPARIN SODIUM (PORCINE) 1000 UNIT/ML IJ SOLN
INTRAMUSCULAR | Status: DC | PRN
  Administered 2015-11-15: 5000 [IU] via INTRAVENOUS

## 2015-11-15 MED ORDER — IOPAMIDOL (ISOVUE-370) INJECTION 76%
INTRAVENOUS | Status: DC | PRN
  Administered 2015-11-15: 70 mL via INTRAVENOUS

## 2015-11-15 MED ORDER — HEPARIN (PORCINE) IN NACL 2-0.9 UNIT/ML-% IJ SOLN
INTRAMUSCULAR | Status: DC | PRN
  Administered 2015-11-15: 1500 mL

## 2015-11-15 MED ORDER — MIDAZOLAM HCL 2 MG/2ML IJ SOLN
INTRAMUSCULAR | Status: DC | PRN
  Administered 2015-11-15: 2 mg via INTRAVENOUS

## 2015-11-15 MED ORDER — HEPARIN SODIUM (PORCINE) 1000 UNIT/ML IJ SOLN
INTRAMUSCULAR | Status: AC
Start: 2015-11-15 — End: 2015-11-15
  Filled 2015-11-15: qty 1

## 2015-11-15 MED ORDER — FENTANYL CITRATE (PF) 100 MCG/2ML IJ SOLN
INTRAMUSCULAR | Status: DC | PRN
  Administered 2015-11-15: 25 ug via INTRAVENOUS

## 2015-11-15 SURGICAL SUPPLY — 10 items
CATH IMPULSE 5F ANG/FL3.5 (CATHETERS) ×2 IMPLANT
CATH INFINITI 5 FR JL3.5 (CATHETERS) ×2 IMPLANT
DEVICE RAD COMP TR BAND LRG (VASCULAR PRODUCTS) ×2 IMPLANT
GLIDESHEATH SLEND SS 6F .021 (SHEATH) ×2 IMPLANT
KIT HEART LEFT (KITS) ×2 IMPLANT
PACK CARDIAC CATHETERIZATION (CUSTOM PROCEDURE TRAY) ×2 IMPLANT
SYR MEDRAD MARK V 150ML (SYRINGE) ×2 IMPLANT
TRANSDUCER W/STOPCOCK (MISCELLANEOUS) ×2 IMPLANT
TUBING CIL FLEX 10 FLL-RA (TUBING) ×2 IMPLANT
WIRE SAFE-T 1.5MM-J .035X260CM (WIRE) ×2 IMPLANT

## 2015-11-15 NOTE — Research (Signed)
[place] Study Informed Consent   Subject Name: Cody Medina  Subject met inclusion and exclusion criteria.  The informed consent form, study requirements and expectations were reviewed with the subject and questions and concerns were addressed prior to the signing of the consent form.  The subject verbalized understanding of the trial requirements.  The subject agreed to participate in the  trial and signed the informed consent.  The informed consent was obtained prior to performance of any protocol-specific procedures for the subject.  A copy of the signed informed consent was given to the subject and a copy was placed in the subject's medical record.  [name] 11/15/2015, 1:56 PM

## 2015-11-15 NOTE — Discharge Instructions (Signed)
Return to Work ______[name]____________________________________________ was treated at our facility. INJURY OR ILLNESS WAS: _____ Work related __X___ Not work related _____ Undetermined if work related RETURN TO WORK  Employee may return to work on ___8/31/17________________.  Employee may return to modified work on ____________________. WORK ACTIVITY RESTRICTIONS Work activities that are not tolerated include: _____ Bending _____ Prolonged sitting __X___ Lifting more than ________10____________ lb _____ Squatting _____ Prolonged standing _____ Climbing ___X__ Reaching ____X_ Pushing and pulling _____ Walking _____ Other ____________________ These restrictions are effective until ____________________. Show this Return to Work statement to your supervisor at work as soon as possible. Your employer should be aware of your condition and can help with the necessary work activity restrictions. If you wish to return to work sooner than the date that is listed above, or if you have further problems that make it difficult for you to return at that time, please call our clinic or your health care provider. _________________________________________ Health Care Provider Name (printed) _________________________________________ Health Care Provider (signature)  _________________________________________ Date   This information is not intended to replace advice given to you by your health care provider. Make sure you discuss any questions you have with your health care provider.   Document Released: 03/11/2005 Document Revised: 04/01/2014 Document Reviewed: 10/08/2013 Elsevier Interactive Patient Education 2016 Elsevier Inc. Radial Site Care Refer to this sheet in the next few weeks. These instructions provide you with information about caring for yourself after your procedure. Your health care provider may also give you more specific instructions. Your treatment has been planned  according to current medical practices, but problems sometimes occur. Call your health care provider if you have any problems or questions after your procedure. WHAT TO EXPECT AFTER THE PROCEDURE After your procedure, it is typical to have the following:  Bruising at the radial site that usually fades within 1-2 weeks.  Blood collecting in the tissue (hematoma) that may be painful to the touch. It should usually decrease in size and tenderness within 1-2 weeks. HOME CARE INSTRUCTIONS  Take medicines only as directed by your health care provider.  You may shower 24-48 hours after the procedure or as directed by your health care provider. Remove the bandage (dressing) and gently wash the site with plain soap and water. Pat the area dry with a clean towel. Do not rub the site, because this may cause bleeding.  Do not take baths, swim, or use a hot tub until your health care provider approves.  Check your insertion site every day for redness, swelling, or drainage.  Do not apply powder or lotion to the site.  Do not flex or bend the affected arm for 24 hours or as directed by your health care provider.  Do not push or pull heavy objects with the affected arm for 24 hours or as directed by your health care provider.  Do not lift over 10 lb (4.5 kg) for 5 days after your procedure or as directed by your health care provider.  Ask your health care provider when it is okay to:  Return to work or school.  Resume usual physical activities or sports.  Resume sexual activity.  Do not drive home if you are discharged the same day as the procedure. Have someone else drive you.  You may drive 24 hours after the procedure unless otherwise instructed by your health care provider.  Do not operate machinery or power tools for 24 hours after the procedure.  If your procedure was done  as an outpatient procedure, which means that you went home the same day as your procedure, a responsible adult  should be with you for the first 24 hours after you arrive home.  Keep all follow-up visits as directed by your health care provider. This is important. SEEK MEDICAL CARE IF:  You have a fever.  You have chills.  You have increased bleeding from the radial site. Hold pressure on the site. SEEK IMMEDIATE MEDICAL CARE IF:  You have unusual pain at the radial site.  You have redness, warmth, or swelling at the radial site.  You have drainage (other than a small amount of blood on the dressing) from the radial site.  The radial site is bleeding, and the bleeding does not stop after 30 minutes of holding steady pressure on the site.  Your arm or hand becomes pale, cool, tingly, or numb.   This information is not intended to replace advice given to you by your health care provider. Make sure you discuss any questions you have with your health care provider.   Document Released: 04/13/2010 Document Revised: 04/01/2014 Document Reviewed: 09/27/2013 Elsevier Interactive Patient Education [employer]2016 Elsevier Inc.

## 2015-11-15 NOTE — H&P (View-Only) (Signed)
OFFICE NOTE  Chief Complaint:  Chest pain  Primary Care Physician: Emeterio ReeveWOLTERS,SHARON A, MD  HPI:  Cody Medina is a 45 y.o. male who has a past medical history significant for anxiety, depression, ADHD, hypertension (but not on medication), dyslipidemia and GERD. He reports over the past month and a half he's had chest discomfort. He saw his primary care provider and was placed on aspirin. Described as central chest pressure which radiates to his left arm. He's also had central chest wall tenderness which is exquisitely tender to the touch. He also reports numbness and tingling in his left fingers. The left arm heaviness and pain is always associated with chest discomfort. He reports he thinks it's been getting worse over the past month. He's also had some progressive sweating and shortness of breath. Interestingly, he had a stress test in 2006 which was negative for ischemia and a chronic catheterization in 2010 at the age of 45 with Dr. Eldridge DaceVaranasi, which showed no obstructive CAD.   PMHx:  Past Medical History:  Diagnosis Date  . ADHD (attention deficit hyperactivity disorder)    antidepressants are for ADHD  . Chest pain   . Depression   . Gout   . Hyperlipidemia   . Hypertension   . Leukopenia   . Low HDL (under 40)   . Symptomatic cholelithiasis 03/2012  . Thalassemia minor     Past Surgical History:  Procedure Laterality Date  . CARDIAC CATHETERIZATION  11/14/2008  . CHOLECYSTECTOMY  04/15/2012   Procedure: LAPAROSCOPIC CHOLECYSTECTOMY;  Surgeon: Shelly Rubensteinouglas A Blackman, MD;  Location: [facility];  Service: General;  Laterality: N/A;  . LARYNGOSCOPY  11/22/2008  . MANDIBLE RECONSTRUCTION  1992    FAMHx:  Family History  Problem Relation Age of Onset  . Osteoporosis Mother   . Hypertension Mother   . Post-traumatic stress disorder Father   . Cancer Maternal Aunt     lung  . Cancer Maternal Grandmother     lung & non hodgkins  . Cancer Maternal  Grandfather     non hodgkins  . Cancer Paternal Aunt   . Heart failure Paternal Grandfather     SOCHx:   reports that he quit smoking about 22 years ago. He has never used smokeless tobacco. He reports that he does not drink alcohol or use drugs.  ALLERGIES:  Allergies  Allergen Reactions  . Morphine Sulfate Nausea And Vomiting    ROS: Pertinent items noted in HPI and remainder of comprehensive ROS otherwise negative.  HOME MEDS: Current Outpatient Prescriptions  Medication Sig Dispense Refill  . amphetamine-dextroamphetamine (ADDERALL) 20 MG tablet Take 20 mg by mouth daily.    Marland Kitchen. aspirin 81 MG tablet Take 81 mg by mouth daily.    . clonazePAM (KLONOPIN) 1 MG tablet Take 1 mg by mouth 2 (two) times daily.     . DULoxetine (CYMBALTA) 60 MG capsule Take 60 mg by mouth daily.    . naproxen sodium (ALEVE) 220 MG tablet Take 220 mg by mouth as needed.    . zaleplon (SONATA) 10 MG capsule Take 10 mg by mouth at bedtime.     No current facility-administered medications for this visit.     LABS/IMAGING: No results found for this or any previous visit (from the past 48 hour(s)). No results found.  WEIGHTS: Wt Readings from Last 3 Encounters:  11/01/15 238 lb 9.6 oz (108.2 kg)  05/29/13 220 lb (99.8 kg)  05/13/12 216 lb (98 kg)  VITALS: BP (!) 145/93   Pulse 100   Ht 5\' 9"  (1.753 m)   Wt 238 lb 9.6 oz (108.2 kg)   BMI 35.24 kg/m   EXAM: General appearance: alert and no distress Neck: no carotid bruit and no JVD Lungs: clear to auscultation bilaterally Heart: regular tachycardia, no murmur Abdomen: soft, non-tender; bowel sounds normal; no masses,  no organomegaly and obese Extremities: extremities normal, atraumatic, no cyanosis or edema Pulses: 2+ and symmetric Skin: Skin color, texture, turgor normal. No rashes or lesions Neurologic: Grossly normal Psych: Anxious, poor attention  EKG: Normal sinus rhythm at 100  ASSESSMENT: 1. Chest pain with typical and  atypical features - somewhat concerning for unstable angina 2. Concomitant chest wall pain which is reproducible - likely neuropathic 3. Dyslipidemia 4. Hypertension 5. Family history of heart disease  PLAN: 1.   Mr. Charlett LangoSchimmenti has described worsening chest pain over the past month and a half radiates to his left arm and is described as a pressure or heaviness that is worse with exertion and relieved by rest. This is descriptive of unstable angina. He also has exquisite left chest wall tenderness to palpation. This is very atypical for angina. He did have heart catheterization in 2010 which indicated normal coronary arteries. He does have cardiac risk factors including hypertension but has not been treated for this, as well as dyslipidemia. Based on his progressive and worsening symptoms I would recommend repeat heart catheterization. I did discuss risks, benefits and alternatives to car catheterization today with him in detail and he is agreeable to proceed. We'll try to arrange that as soon as possible. If the cardiac catheterization is unrevealing, then he should have further workup for chest wall pain, possibly MRI of the spine to look for a neuropathic pain source.  Thanks for the kind referral.  Chrystie NoseKenneth C. Hilty, MD, [facility] Attending Cardiologist [facility]  Chrystie NoseKenneth C Hilty 11/01/2015, 4:46 PM

## 2015-11-15 NOTE — Interval H&P Note (Signed)
History and Physical Interval Note:  11/15/2015 1:03 PM  Cody Medina  has presented today for surgery, with the diagnosis of chest pain  The various methods of treatment have been discussed with the patient and family. After consideration of risks, benefits and other options for treatment, the patient has consented to  Procedure(s): Left Heart Cath and Coronary Angiography (N/A) as a surgical intervention .  The patient's history has been reviewed, patient examined, no change in status, stable for surgery.  I have reviewed the patient's chart and labs.  Questions were answered to the patient's satisfaction.     Tonny Bollmanooper, Patrena Santalucia

## 2015-11-16 ENCOUNTER — Encounter (HOSPITAL_COMMUNITY): Payer: Self-pay | Admitting: Cardiovascular Disease

## 2015-11-16 NOTE — Progress Notes (Signed)
Thanks Kathlene NovemberMike - I will treat him aggressively.  -Italyhad

## 2015-11-17 MED FILL — Lidocaine HCl Local Preservative Free (PF) Inj 1%: INTRAMUSCULAR | Qty: 30 | Status: AC

## 2015-11-21 ENCOUNTER — Telehealth: Payer: Self-pay | Admitting: Internal Medicine

## 2015-11-21 NOTE — Telephone Encounter (Signed)
Spoke to patient.   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 He states Dr. Excell Seltzerooper expressed concern to him about going back to work too soon given this and [facility].   Will defer to MD regarding length of time needed out of work

## 2015-11-21 NOTE — Telephone Encounter (Signed)
Pt needs another letter for work,he was scheduled for 1 week originally.His next visit after his procedure is 12-13-15. He will need a note to stay out uuntil after that time..Pt's job will not allow light duty.Please call when letter is ready.

## 2015-11-21 NOTE — Telephone Encounter (Signed)
Ok for out of work until follow-up with me.  Dr. HRexene Edison

## 2015-11-22 ENCOUNTER — Telehealth: Payer: Self-pay | Admitting: *Deleted

## 2015-11-22 ENCOUNTER — Encounter: Payer: Self-pay | Admitting: Internal Medicine

## 2015-11-22 NOTE — Telephone Encounter (Signed)
Patient notified letter is ready for pick up. 

## 2015-11-22 NOTE — Telephone Encounter (Signed)
Pt walked in-pt c/o left wrist swelling and decreased ROM since heart cath last Wed 8/23 (R Radial approach).  Pt reports this is where one of the IVs were placed.  No redness or swelling noted, +ROM, sensation intact.  Advised to continue to monitor for 3-4  days and if continued or worsened refer to PCP.  Pt verbalized understanding.

## 2015-12-13 ENCOUNTER — Encounter: Payer: Self-pay | Admitting: Internal Medicine

## 2015-12-13 ENCOUNTER — Ambulatory Visit (INDEPENDENT_AMBULATORY_CARE_PROVIDER_SITE_OTHER): Payer: BLUE CROSS/BLUE SHIELD | Admitting: Internal Medicine

## 2015-12-13 VITALS — BP 157/105 | HR 94 | Ht 70.0 in | Wt 235.0 lb

## 2015-12-13 DIAGNOSIS — I25119 Atherosclerotic heart disease of native coronary artery with unspecified angina pectoris: Secondary | ICD-10-CM | POA: Insufficient documentation

## 2015-12-13 DIAGNOSIS — E785 Hyperlipidemia, unspecified: Secondary | ICD-10-CM | POA: Diagnosis not present

## 2015-12-13 DIAGNOSIS — I10 Essential (primary) hypertension: Secondary | ICD-10-CM | POA: Diagnosis not present

## 2015-12-13 MED ORDER — LISINOPRIL 10 MG PO TABS: 10.0000 mg | ORAL_TABLET | Freq: Every day | ORAL | 6 refills | Status: AC

## 2015-12-13 NOTE — Patient Instructions (Signed)
Medication Instructions:  START Lisinopril 10mg  Take 1 tab by mouth once a day.  Labwork: Your physician recommends that you return for lab work in: FASTING Lipid.  Testing/Procedures: None Ordered  Follow-Up: Your physician wants you to follow-up in: 6 months with Dr Rennis GoldenHilty. You will receive a reminder letter in the mail two months in advance. If you don't receive a letter, please call our office to schedule the follow-up appointment.  Schedule appt with Pharm D for Hypertension in 2 weeks.  Any Other Special Instructions Will Be Listed Below (If Applicable).      If you need a refill on your cardiac medications before your next appointment, please call your pharmacy.

## 2015-12-13 NOTE — Progress Notes (Signed)
OFFICE NOTE  Chief Complaint:  Chest pain  Primary Care Physician: Emeterio Reeve, MD  HPI:  Suhaan Perleberg Baysinger is a 45 y.o. male who has a past medical history significant for anxiety, depression, ADHD, hypertension (but not on medication), dyslipidemia and GERD. He reports over the past month and a half he's had chest discomfort. He saw his primary care provider and was placed on aspirin. Described as central chest pressure which radiates to his left arm. He's also had central chest wall tenderness which is exquisitely tender to the touch. He also reports numbness and tingling in his left fingers. The left arm heaviness and pain is always associated with chest discomfort. He reports he thinks it's been getting worse over the past month. He's also had some progressive sweating and shortness of breath. Interestingly, he had a stress test in 2006 which was negative for ischemia and a chronic catheterization in 2010 at the age of 26 with Dr. Eldridge Dace, which showed no obstructive CAD.   12/13/2015  Mr. Geidel returns today for follow-up. He underwent cardiac catheterization on 11/15/2015 by Dr. Tonny Bollman. This demonstrated proximal to mid LAD 50% stenosis otherwise no significant obstruction. LVEF was 50-55% by visual estimate. He reports that since cardiac catheterization he feels much better. He said whatever Dr. Excell Seltzer did to loosen up things in his arteries did the trick. I informed him that no intervention was performed and all we did was to inject the coronaries for diagnostic purposes. Nonetheless he denies any recurrent chest pain. He is interested in more physical activity and is in the process of becoming a Engineer, water. He realizes is a high risk profession for him and he may be at risk for worsening coronary artery disease and that we will have to follow him closely. He does need optimization of his adequate therapy. He is currently only on daily aspirin. I would  recommend a fasting lipid profile and starting cholesterol medication with a goal LDL less than 70. He would also benefit from some hypertension management. Blood pressure today was 157/105, but did come down to 130/90. Diastolic still being elevated, there is argument for treatment including the coronary benefit of adding medication.  PMHx:  Past Medical History:  Diagnosis Date  . ADHD (attention deficit hyperactivity disorder)    antidepressants are for ADHD  . Chest pain   . Depression   . Gout   . Hyperlipidemia   . Hypertension   . Leukopenia   . Low HDL (under 40)   . Symptomatic cholelithiasis 03/2012  . Thalassemia minor     Past Surgical History:  Procedure Laterality Date  . CARDIAC CATHETERIZATION  11/14/2008  . CARDIAC CATHETERIZATION N/A 11/15/2015   Procedure: Left Heart Cath and Coronary Angiography;  Surgeon: Tonny Bollman, MD;  [facility];  Service: Cardiovascular;  Laterality: N/A;  . CHOLECYSTECTOMY  04/15/2012   Procedure: LAPAROSCOPIC CHOLECYSTECTOMY;  Surgeon: Shelly Rubenstein, MD;  Location: [facility];  Service: General;  Laterality: N/A;  . LARYNGOSCOPY  11/22/2008  . MANDIBLE RECONSTRUCTION  1992    FAMHx:  Family History  Problem Relation Age of Onset  . Osteoporosis Mother   . Hypertension Mother   . Post-traumatic stress disorder Father   . Prostate cancer Father   . Lung cancer Maternal Aunt   . Atrial fibrillation Maternal Grandmother   . Lung cancer Maternal Grandmother   . Non-Hodgkin's lymphoma Maternal Grandmother   . Non-Hodgkin's lymphoma Maternal Grandfather   .  Cancer Paternal Aunt   . Heart failure Paternal Grandfather   . Heart attack Paternal Grandfather   . Tuberculosis Paternal Grandmother     SOCHx:   reports that he quit smoking about 22 years ago. His smoking use included Cigarettes. He smoked 1.00 pack per day. He has never used smokeless tobacco. He reports that he does not drink alcohol  or use drugs.  ALLERGIES:  Allergies  Allergen Reactions  . Morphine Sulfate Nausea And Vomiting    ROS: Pertinent items noted in HPI and remainder of comprehensive ROS otherwise negative.  HOME MEDS: Current Outpatient Prescriptions  Medication Sig Dispense Refill  . amphetamine-dextroamphetamine (ADDERALL) 20 MG tablet Take 20 mg by mouth daily.    Marland Kitchen. aspirin 81 MG tablet Take 81 mg by mouth daily.    . clonazePAM (KLONOPIN) 1 MG tablet Take 1 mg by mouth 2 (two) times daily.     . DULoxetine (CYMBALTA) 60 MG capsule Take 60 mg by mouth daily.    . naproxen sodium (ALEVE) 220 MG tablet Take 220 mg by mouth 2 (two) times daily as needed (pain).     . zaleplon (SONATA) 10 MG capsule Take 10 mg by mouth at bedtime.     No current facility-administered medications for this visit.     LABS/IMAGING: No results found for this or any previous visit (from the past 48 hour(s)). No results found.  WEIGHTS: Wt Readings from Last 3 Encounters:  12/13/15 235 lb (106.6 kg)  11/15/15 236 lb (107 kg)  11/01/15 238 lb 9.6 oz (108.2 kg)    VITALS: BP (!) 157/105   Pulse 94   Ht 5\' 10"  (1.778 m)   Wt 235 lb (106.6 kg)   BMI 33.72 kg/m   EXAM: Deferred  EKG: Deferred  ASSESSMENT: 1. 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 Concomitant chest wall pain which is reproducible - likely neuropathic 3. Dyslipidemia 4. Hypertension 5. Family history of heart disease  PLAN: 1.   Mr. Charlett LangoSchimmenti has moderate nonobstructive LAD disease but no culprit lesion for his chest pain. He will need medical optimization of his risk factors. He should continue on daily 81 mg aspirin. We will check a lipid profile and likely start statin medication. He also benefit from treatment of his hypertension. Plan to start lisinopril 10 mg daily. He will have the hypertension clinic follow-up in 2 weeks for titration of medications if  necessary. Follow-up with me afterwards in 6 months.  Chrystie NoseKenneth C. Kimberly Coye, MD, [facility] InstituteFACC Attending Cardiologist [facility]  Chrystie NoseKenneth C Aria Jarrard 12/13/2015, 12:29 PM

## 2015-12-29 ENCOUNTER — Ambulatory Visit: Payer: BLUE CROSS/BLUE SHIELD

## 2015-12-29 NOTE — Progress Notes (Deleted)
Patient ID: Donella Stadenthony J Teska                 DOB: Jan 22, 1971                      MRN: [id number]     HPI: Wende Creasenthony J Schremp is a 45 y.o. male patient of Dr. Rennis GoldenHilty with PMH below who presents today for hypertension evaluation. He recently underwent catheterization which showed 50% stenosis in LAD, but otherwise no significant obstruction. His LVEF was 50-55%.    Cardiac Hx: HTN, angina, CAD, HLD  Current HTN meds:  Lisinopril 10mg  daily  BP goal: <140/90  Family History:   Social History:   Diet:   Exercise:   Home BP readings:   Wt Readings from Last 3 Encounters:  12/13/15 235 lb (106.6 kg)  11/15/15 236 lb (107 kg)  11/01/15 238 lb 9.6 oz (108.2 kg)   BP Readings from Last 3 Encounters:  12/13/15 (!) 157/105  11/15/15 (!) 130/92  11/01/15 (!) 145/93   Pulse Readings from Last 3 Encounters:  12/13/15 94  11/15/15 65  11/01/15 100    Renal function: CrCl cannot be calculated (Unknown ideal weight.).  Past Medical History:  Diagnosis Date  . ADHD (attention deficit hyperactivity disorder)    antidepressants are for ADHD  . Chest pain   . Depression   . Gout   . Hyperlipidemia   . Hypertension   . Leukopenia   . Low HDL (under 40)   . Symptomatic cholelithiasis 03/2012  . Thalassemia minor     Current Outpatient Prescriptions on File Prior to Visit  Medication Sig Dispense Refill  . amphetamine-dextroamphetamine (ADDERALL) 20 MG tablet Take 20 mg by mouth daily.    Marland Kitchen. aspirin 81 MG tablet Take 81 mg by mouth daily.    . clonazePAM (KLONOPIN) 1 MG tablet Take 1 mg by mouth 2 (two) times daily.     . DULoxetine (CYMBALTA) 60 MG capsule Take 60 mg by mouth daily.    Marland Kitchen. lisinopril (PRINIVIL,ZESTRIL) 10 MG tablet Take 1 tablet (10 mg total) by mouth daily. 30 tablet 6  . naproxen sodium (ALEVE) 220 MG tablet Take 220 mg by mouth 2 (two) times daily as needed (pain).     . zaleplon (SONATA) 10 MG capsule Take 10 mg by mouth at bedtime.     No current  facility-administered medications on file prior to visit.     Allergies  Allergen Reactions  . Morphine Sulfate Nausea And Vomiting    There were no vitals taken for this visit.   Assessment/Plan: Hypertension:    Thank you, Freddie ApleyKelley M. Cleatis PolkaAuten, PharmD  [facility]  12/29/2015 1:46 PM

## 2020-01-11 ENCOUNTER — Emergency Department (HOSPITAL_BASED_OUTPATIENT_CLINIC_OR_DEPARTMENT_OTHER): Payer: Managed Care, Other (non HMO)

## 2020-01-11 ENCOUNTER — Other Ambulatory Visit: Payer: Self-pay

## 2020-01-11 ENCOUNTER — Encounter (HOSPITAL_BASED_OUTPATIENT_CLINIC_OR_DEPARTMENT_OTHER): Payer: Self-pay | Admitting: Emergency Medicine

## 2020-01-11 ENCOUNTER — Emergency Department (HOSPITAL_BASED_OUTPATIENT_CLINIC_OR_DEPARTMENT_OTHER)
Admission: EM | Admit: 2020-01-11 | Discharge: 2020-01-11 | Disposition: A | Discharge status: Home / Self Care | Payer: Managed Care, Other (non HMO) | Attending: Emergency Medicine | Admitting: Emergency Medicine

## 2020-01-11 DIAGNOSIS — R519 Headache, unspecified: Secondary | ICD-10-CM | POA: Diagnosis present

## 2020-01-11 DIAGNOSIS — Z79899 Other long term (current) drug therapy: Secondary | ICD-10-CM | POA: Diagnosis not present

## 2020-01-11 DIAGNOSIS — I2510 Atherosclerotic heart disease of native coronary artery without angina pectoris: Secondary | ICD-10-CM | POA: Insufficient documentation

## 2020-01-11 DIAGNOSIS — R42 Dizziness and giddiness: Secondary | ICD-10-CM | POA: Diagnosis not present

## 2020-01-11 DIAGNOSIS — I10 Essential (primary) hypertension: Secondary | ICD-10-CM | POA: Insufficient documentation

## 2020-01-11 DIAGNOSIS — Z7982 Long term (current) use of aspirin: Secondary | ICD-10-CM | POA: Insufficient documentation

## 2020-01-11 DIAGNOSIS — Z87891 Personal history of nicotine dependence: Secondary | ICD-10-CM | POA: Diagnosis not present

## 2020-01-11 LAB — CBC WITH DIFFERENTIAL/PLATELET
Abs Immature Granulocytes: 0.01 10*3/uL (ref 0.00–0.07)
Basophils Absolute: 0 10*3/uL (ref 0.0–0.1)
Basophils Relative: 0 %
Eosinophils Absolute: 0 10*3/uL (ref 0.0–0.5)
Eosinophils Relative: 0 %
HCT: 44.7 % (ref 39.0–52.0)
Hemoglobin: 13.8 g/dL (ref 13.0–17.0)
Immature Granulocytes: 0 %
Lymphocytes Relative: 18 %
Lymphs Abs: 0.9 10*3/uL (ref 0.7–4.0)
MCH: 19.2 pg — ABNORMAL LOW (ref 26.0–34.0)
MCHC: 30.9 g/dL (ref 30.0–36.0)
MCV: 62.3 fL — ABNORMAL LOW (ref 80.0–100.0)
Monocytes Absolute: 0.4 10*3/uL (ref 0.1–1.0)
Monocytes Relative: 7 %
Neutro Abs: 3.8 10*3/uL (ref 1.7–7.7)
Neutrophils Relative %: 75 %
Platelets: 123 10*3/uL — ABNORMAL LOW (ref 150–400)
RBC: 7.18 MIL/uL — ABNORMAL HIGH (ref 4.22–5.81)
RDW: 18.1 % — ABNORMAL HIGH (ref 11.5–15.5)
WBC: 5.1 10*3/uL (ref 4.0–10.5)
nRBC: 0 % (ref 0.0–0.2)

## 2020-01-11 LAB — COMPREHENSIVE METABOLIC PANEL
ALT: 38 U/L (ref 0–44)
AST: 22 U/L (ref 15–41)
Albumin: 4.3 g/dL (ref 3.5–5.0)
Alkaline Phosphatase: 66 U/L (ref 38–126)
Anion gap: 10 (ref 5–15)
BUN: 17 mg/dL (ref 6–20)
CO2: 27 mmol/L (ref 22–32)
Calcium: 9.3 mg/dL (ref 8.9–10.3)
Chloride: 98 mmol/L (ref 98–111)
Creatinine, Ser: 1.03 mg/dL (ref 0.61–1.24)
GFR, Estimated: >60 mL/min (ref >60)
Glucose, Bld: 118 mg/dL — ABNORMAL HIGH (ref 70–99)
Potassium: 4.3 mmol/L (ref 3.5–5.1)
Sodium: 135 mmol/L (ref 135–145)
Total Bilirubin: 0.7 mg/dL (ref 0.3–1.2)
Total Protein: 7.5 g/dL (ref 6.5–8.1)

## 2020-01-11 LAB — TROPONIN I (HIGH SENSITIVITY)
Troponin I (High Sensitivity): 2 ng/L (ref <18)
Troponin I (High Sensitivity): 2 ng/L (ref <18)

## 2020-01-11 MED ORDER — LISINOPRIL 10 MG PO TABS: 10.0000 mg | ORAL_TABLET | Freq: Every day | ORAL | 0 refills | Status: AC

## 2020-01-11 MED ORDER — SODIUM CHLORIDE 0.9 % IV BOLUS
1000.0000 mL | Freq: Once | INTRAVENOUS | Status: AC
  Administered 2020-01-11: 1000 mL via INTRAVENOUS

## 2020-01-11 MED ORDER — MECLIZINE HCL 25 MG PO TABS: 25.0000 mg | ORAL_TABLET | Freq: Three times a day (TID) | ORAL | 0 refills | Status: AC | PRN

## 2020-01-11 MED ORDER — MECLIZINE HCL 25 MG PO TABS
25.0000 mg | ORAL_TABLET | Freq: Once | ORAL | Status: AC
  Administered 2020-01-11: 25 mg via ORAL
  Filled 2020-01-11: qty 1

## 2020-01-11 MED ORDER — DIPHENHYDRAMINE HCL 50 MG/ML IJ SOLN
25.0000 mg | Freq: Once | INTRAMUSCULAR | Status: AC
  Administered 2020-01-11: 25 mg via INTRAVENOUS
  Filled 2020-01-11: qty 1

## 2020-01-11 MED ORDER — ONDANSETRON HCL 4 MG/2ML IJ SOLN
4.0000 mg | Freq: Once | INTRAMUSCULAR | Status: AC
  Administered 2020-01-11: 4 mg via INTRAVENOUS
  Filled 2020-01-11: qty 2

## 2020-01-11 MED ORDER — METOCLOPRAMIDE HCL 5 MG/ML IJ SOLN
10.0000 mg | Freq: Once | INTRAMUSCULAR | Status: AC
  Administered 2020-01-11: 10 mg via INTRAVENOUS
  Filled 2020-01-11: qty 2

## 2020-01-11 NOTE — ED Notes (Signed)
Family member requested update, pt notified, updating family

## 2020-01-11 NOTE — ED Provider Notes (Signed)
[facility] EMERGENCY DEPARTMENT Provider Note   CSN: [id number] Arrival date & time: 01/11/20  1046     History Chief Complaint  Patient presents with  . Headache    Cody Medina is a 49 y.o. male possible history of ADHD, chest pain, depression, hyperlipidemia, hypertension who presents for evaluation of headache, dizziness that began this morning.  He states he was working at the computer this morning about an hour prior to ED arrival.  He states he started developing a mild headache.  He states that headache started progressively worsening and ramping up in intensity.  He states that then he started feeling dizzy and felt like the room was spinning.  He states that he tried to get up and walk around but felt like he was going to fall because the room was spinning around him.  He states he has also started having some nausea.  He has not had any vomiting.  When he came to the emergency department, he started feeling "heaviness in his chest."  He denies that this is pain but states that he just feels like "he has to fight to breathe."  He states he was in his normal state of health prior to onset of symptoms.  He did not have any preceding trauma, injury, fall.  He has not had any fever.  He denies any abdominal pain, numbness/weakness of his arms or legs.  He does have history of hypertension but states that he is not on any medication as he was told "his blood pressure is not high enough."  He states he follows with cardiology because he had been having some chest pain and shortness of breath and had a catheterization.  His last one was in 2017 which showed some moderate disease.  No intervention at that time.  He is on aspirin.  No other blood thinners. He denies any exogenous hormone use, recent immobilization, prior history of DVT/PE, recent surgery, leg swelling, or long travel.   The history is provided by the patient.       Past Medical History:  Diagnosis Date  .  ADHD (attention deficit hyperactivity disorder)    antidepressants are for ADHD  . Chest pain   . Depression   . Gout   . Hyperlipidemia   . Hypertension   . Leukopenia   . Low HDL (under 40)   . Symptomatic cholelithiasis 03/2012  . Thalassemia minor     Patient Active Problem List   Diagnosis Date Noted  . Coronary artery disease involving native coronary artery of native heart with angina pectoris (HCC) 12/13/2015  . Unstable angina pectoris (HCC) 11/14/2015  . Hyperlipidemia 11/01/2015  . Pre-procedural examination 11/01/2015  . Symptomatic cholelithiasis 03/31/2012  . OTHER DISEASES OF VOCAL CORDS 11/22/2008  . ALLERGIC RHINITIS 10/13/2008  . FEVER UNSPECIFIED 10/13/2008  . COUGH 10/13/2008  . CHEST PAIN 10/13/2008  . DEPRESSION 10/12/2008  . Essential hypertension 10/12/2008  . G E R D 10/12/2008    Past Surgical History:  Procedure Laterality Date  . CARDIAC CATHETERIZATION  11/14/2008  . CARDIAC CATHETERIZATION N/A 11/15/2015   Procedure: Left Heart Cath and Coronary Angiography;  Surgeon: Tonny Bollman, MD;  Location: [facility] INVASIVE CV LAB;  Service: Cardiovascular;  Laterality: N/A;  . CHOLECYSTECTOMY  04/15/2012   Procedure: LAPAROSCOPIC CHOLECYSTECTOMY;  Surgeon: Shelly Rubenstein, MD;  Location: [facility];  Service: General;  Laterality: N/A;  . LARYNGOSCOPY  11/22/2008  . MANDIBLE RECONSTRUCTION  1992  Family History  Problem Relation Age of Onset  . Osteoporosis Mother   . Hypertension Mother   . Post-traumatic stress disorder Father   . Prostate cancer Father   . Lung cancer Maternal Aunt   . Atrial fibrillation Maternal Grandmother   . Lung cancer Maternal Grandmother   . Non-Hodgkin's lymphoma Maternal Grandmother   . Non-Hodgkin's lymphoma Maternal Grandfather   . Cancer Paternal Aunt   . Heart failure Paternal Grandfather   . Heart attack Paternal Grandfather   . Tuberculosis Paternal Grandmother     Social History    Tobacco Use  . Smoking status: Former Smoker    Packs/day: 1.00    Types: Cigarettes    Quit date: 03/25/1993    Years since quitting: 26.8  . Smokeless tobacco: Never Used  Substance Use Topics  . Alcohol use: No  . Drug use: No    Home Medications Prior to Admission medications   Medication Sig Start Date End Date Taking? Authorizing Provider  amphetamine-dextroamphetamine (ADDERALL) 20 MG tablet Take 20 mg by mouth daily.    [provider]  aspirin 81 MG tablet Take 81 mg by mouth daily.    [provider]  clonazePAM (KLONOPIN) 1 MG tablet Take 1 mg by mouth 2 (two) times daily.  01/28/12   [provider]  DULoxetine (CYMBALTA) 60 MG capsule Take 60 mg by mouth daily.    [provider]  lisinopril (PRINIVIL,ZESTRIL) 10 MG tablet Take 1 tablet (10 mg total) by mouth daily. 12/13/15 03/12/16  Chrystie Nose, MD  lisinopril (ZESTRIL) 10 MG tablet Take 1 tablet (10 mg total) by mouth daily. 01/11/20 02/10/20  Maxwell Caul, PA-C  meclizine (ANTIVERT) 25 MG tablet Take 1 tablet (25 mg total) by mouth 3 (three) times daily as needed for dizziness. 01/11/20   Maxwell Caul, PA-C  naproxen sodium (ALEVE) 220 MG tablet Take 220 mg by mouth 2 (two) times daily as needed (pain).     [provider]  zaleplon (SONATA) 10 MG capsule Take 10 mg by mouth at bedtime.    [provider]    Allergies    Morphine sulfate  Review of Systems   Review of Systems  Constitutional: Negative for fever.  Respiratory: Negative for cough and shortness of breath.   Cardiovascular: Negative for chest pain.  Gastrointestinal: Negative for abdominal pain, nausea and vomiting.  Genitourinary: Negative for dysuria and hematuria.  Neurological: Positive for dizziness and headaches.  All other systems reviewed and are negative.   Physical Exam Updated Vital Signs BP 120/85   Pulse 78   Temp 97.8 F (36.6 C) (Oral)   Resp (!) 21   SpO2  97%   Physical Exam Vitals and nursing note reviewed.  Constitutional:      Appearance: Normal appearance. He is well-developed.  HENT:     Head: Normocephalic and atraumatic.  Eyes:     General: Lids are normal.     Extraocular Movements:     Right eye: Nystagmus present.     Left eye: Nystagmus present.     Conjunctiva/sclera: Conjunctivae normal.     Pupils: Pupils are equal, round, and reactive to light.     Comments: PERRL.  Horizontal nystagmus noted to the left.  No rotational or vertical nystagmus.  Neck:     Comments: No carotid bruit. Neck is supple and without rigidity.  Cardiovascular:     Rate and Rhythm: Normal rate and regular rhythm.  Pulses: Normal pulses.          Radial pulses are 2+ on the right side and 2+ on the left side.       Dorsalis pedis pulses are 2+ on the right side and 2+ on the left side.     Heart sounds: Normal heart sounds. No murmur heard.  No friction rub. No gallop.   Pulmonary:     Effort: Pulmonary effort is normal.     Breath sounds: Normal breath sounds.     Comments: Lungs clear to auscultation bilaterally.  Symmetric chest rise.  No wheezing, rales, rhonchi. Abdominal:     Palpations: Abdomen is soft. Abdomen is not rigid.     Tenderness: There is no abdominal tenderness. There is no guarding.     Comments: Abdomen is soft, non-distended, non-tender. No rigidity, No guarding. No peritoneal signs.  Musculoskeletal:        General: Normal range of motion.     Cervical back: Full passive range of motion without pain.  Skin:    General: Skin is warm and dry.     Capillary Refill: Capillary refill takes less than 2 seconds.  Neurological:     Mental Status: He is alert and oriented to person, place, and time.     Comments: Cranial nerves III-XII intact Follows commands, Moves all extremities  5/5 strength to BUE and BLE  Sensation intact throughout all major nerve distributions No pronator drift. No slurred speech. No facial  droop.   Psychiatric:        Speech: Speech normal.     ED Results / Procedures / Treatments   Labs (all labs ordered are listed, but only abnormal results are displayed) Labs Reviewed  COMPREHENSIVE METABOLIC PANEL - Abnormal; Notable for the following components:      Result Value   Glucose, Bld 118 (*)    All other components within normal limits  CBC WITH DIFFERENTIAL/PLATELET - Abnormal; Notable for the following components:   RBC 7.18 (*)    MCV 62.3 (*)    MCH 19.2 (*)    RDW 18.1 (*)    Platelets 123 (*)    All other components within normal limits  TROPONIN I (HIGH SENSITIVITY)  TROPONIN I (HIGH SENSITIVITY)    EKG EKG Interpretation  Date/Time:  Tuesday January 11 2020 11:20:57 EDT Ventricular Rate:  64 PR Interval:    QRS Duration: 90 QT Interval:  411 QTC Calculation: 424 R Axis:   21 Text Interpretation: Sinus rhythm ST elev, probable normal early repol pattern No significant change since prior 9/14 Confirmed by Meridee Score [phone]) on 01/11/2020 11:24:37 AM   Radiology DG Chest 2 View  Result Date: 01/11/2020 CLINICAL DATA:  Chest pain EXAM: CHEST - 2 VIEW COMPARISON:  2017 FINDINGS: Low lung volumes. No new consolidation or edema. No pleural effusion or pneumothorax. Normal heart size. No acute osseous abnormality. IMPRESSION: No acute process in the chest. Electronically Signed   By: Guadlupe Spanish M.D.   On: 01/11/2020 12:16   CT Head Wo Contrast  Result Date: 01/11/2020 CLINICAL DATA:  Headache EXAM: CT HEAD WITHOUT CONTRAST TECHNIQUE: Contiguous axial images were obtained from the base of the skull through the vertex without intravenous contrast. COMPARISON:  2015 FINDINGS: Brain: There is no acute intracranial hemorrhage, mass effect, or edema. Gray-white differentiation is preserved. There is no extra-axial fluid collection. Ventricles and sulci are within normal limits in size and configuration. Vascular: No hyperdense vessel or unexpected  calcification.  Skull: Calvarium is unremarkable. Sinuses/Orbits: No acute finding. Other: None. IMPRESSION: No acute abnormality. Electronically Signed   By: Guadlupe Spanish M.D.   On: 01/11/2020 12:27    Procedures Procedures (including critical care time)  Medications Ordered in ED Medications  sodium chloride 0.9 % bolus 1,000 mL (0 mLs Intravenous Stopped 01/11/20 1456)  ondansetron (ZOFRAN) injection 4 mg (4 mg Intravenous Given 01/11/20 1139)  meclizine (ANTIVERT) tablet 25 mg (25 mg Oral Given 01/11/20 1128)  metoCLOPramide (REGLAN) injection 10 mg (10 mg Intravenous Given 01/11/20 1240)  diphenhydrAMINE (BENADRYL) injection 25 mg (25 mg Intravenous Given 01/11/20 1238)    ED Course  I have reviewed the triage vital signs and the nursing notes.  Pertinent labs & imaging results that were available during my care of the patient were reviewed by me and considered in my medical decision making (see chart for details).  Clinical Course as of Jan 11 1551  Tue Jan 11, 2020  [id number] 49 year old male here after sudden onset of headache and vertiginous symptoms associated with some nausea.  He then experienced some chest tightness.  Currently his symptoms are improved after medications.  His work-up has been fairly unremarkable.  Likely will discharge home and have follow-up with his PCP as long as he continues to improve.   [MB]    Clinical Course User Index [MB] Terrilee Files, MD   MDM Rules/Calculators/A&P                          49 year old male who presents for evaluation of dizziness, headache that began this morning.  States headache started mild in nature and then ramped up followed by dizziness.  Describes it as a room spinning sensation.  No numbness/weakness of his arms or legs.  He has had associated nausea but no vomiting.  He reports that since being here in the ED, he started developing some heaviness in his chest that he describes as "fighting to breathe."  He was fine  prior to onset of symptoms.  No recent fevers, sicknesses.  No history of trauma, injury.  On initially arrival, he is afebrile, appears uncomfortable but no acute distress.  He is slightly hypertensive.  Vitals otherwise stable.  On exam, he has horizontal nystagmus noted to the left.  No obvious neuro deficits.  I laid him down flat and set him up and that exacerbated his symptoms of dizziness.  I suspect his symptoms are due to peripheral vertigo.  I doubt central vertigo.  Given that he has no prior history of headaches, will obtain a head CT for evaluation of any acute intracranial abnormality.  History/physical exam is not concerning for CVA, dural sinus venous thrombosis, meningitis.  Additionally, it sounds like his chest pain is more anxiety related but given his history, will obtain EKG, chest x-ray.   CMP shows no acute abnormalities. CBC shows no leukocytosis or anemia. Hgb stable at 13.8. Head CT negative for any acute abnormalities. CXR negative for any acute abnormalities. Trop negative.   Patient feeling mildly better here after meclizine.  He still having some headache and dizziness.  Will give migraine cocktail and reassess.  Reevaluation after migraine cocktail.  Patient reports improvement in symptoms.  He is no longer dizziness he states headache is completely gone.  Given his cardiac history, will plan for delta troponin though I suspect this is more anxiety related than acute ACS.  I had a long discussion with patient regarding his  blood pressure.  He states that he was told at age 49 that he had high blood pressure but he was never started on any medication.  I reviewed his records and he was in the office with Dr. Rennis GoldenHilty (cardiology) in 2017.  At that time, Dr. Rennis GoldenHilty had prescribed him lisinopril 10 mg.  Patient does not remember ever taking that medication and states he is currently not on anything.  He is very worried about his blood pressure.  Here it has been relatively stable with  systolic blood pressure in the 130s.  He states that at home, he checked it and it was in the 150s.  He does have history of high blood pressure in the past.  He does not currently have a primary care doctor.  He is very concerned about going home without having his blood pressure treated here in the ED.  We discussed risk first benefits of starting a blood pressure medication, including unintended hypotension.  After discussion, we engaged in shared decision making.  I will start patient on a very low-dose of blood pressure medication.  Instructed him to follow-up with a primary care doctor for evaluation.  He will be given a referral to convalesce.  Delta troponin negative.  Discussed results with patient.  Patient is sitting up comfortably with no dizziness.  He states headache is completely gone.  I personally ambulated patient in the ED with no signs of gait ataxia.  Patient symptoms are consistent with benign peripheral vertigo.  Do not suspect central cause of vertigo.  At this time, will plan to treat with meclizine. At this time, patient exhibits no emergent life-threatening condition that require further evaluation in ED. Patient had ample opportunity for questions and discussion. All patient's questions were answered with full understanding. Strict return precautions discussed. Patient expresses understanding and agreement to plan.   Portions of this note were generated with Scientist, clinical (histocompatibility and immunogenetics)Dragon dictation software. Dictation errors may occur despite best attempts at proofreading.   Final Clinical Impression(s) / ED Diagnoses Final diagnoses:  Vertigo    Rx / DC Orders ED Discharge Orders         Ordered    lisinopril (ZESTRIL) 10 MG tablet  Daily        01/11/20 1548    meclizine (ANTIVERT) 25 MG tablet  3 times daily PRN        01/11/20 1548           Maxwell CaulLayden, Vy Badley A, PA-C 01/11/20 1553    Terrilee FilesButler, Michael C, MD 01/11/20 1755

## 2020-01-11 NOTE — ED Notes (Signed)
Patient transported to CT 

## 2020-01-11 NOTE — ED Triage Notes (Addendum)
Pt arrives pov with family with c/o Htn, frontal HA and dizziness that started this am. Pt also reports nausea. Pt reports taking goodies around 0930 today with no relief

## 2020-01-11 NOTE — ED Notes (Signed)
Pt discharged to home. Discharge instructions have been discussed with patient and/or family members. Pt verbally acknowledges understanding d/c instructions, and endorses comprehension to checkout at registration before leaving.  °

## 2020-01-11 NOTE — Discharge Instructions (Signed)
Take meclizine as directed.  As we discussed, we have started you on a very low dose of blood pressure medication.  I provided you an outpatient referral to [facility] wellness clinic that you follow-up to establish a primary care doctor.  Is very important that you establish a primary care doctor.  If at all, you feel like you are about to pass out or your blood pressures are below 100, stop the blood pressure medication immediately.  Return the emergency department for any worsening dizziness, vomiting, difficulty walking.

## 2020-01-11 NOTE — ED Notes (Signed)
Pt reports chest heaviness when triage complete.

## 2021-04-02 ENCOUNTER — Other Ambulatory Visit: Payer: Self-pay

## 2021-04-02 ENCOUNTER — Emergency Department (INDEPENDENT_AMBULATORY_CARE_PROVIDER_SITE_OTHER)
Admission: EM | Admit: 2021-04-02 | Discharge: 2021-04-02 | Disposition: A | Discharge status: Home / Self Care | Payer: 59 | Source: Home / Self Care

## 2021-04-02 DIAGNOSIS — H109 Unspecified conjunctivitis: Secondary | ICD-10-CM

## 2021-04-02 HISTORY — DX: COVID-19: U07.1

## 2021-04-02 LAB — POC SARS CORONAVIRUS 2 AG -  ED: SARS Coronavirus 2 Ag: NEGATIVE

## 2021-04-02 MED ORDER — SULFACETAMIDE SODIUM 10 % OP SOLN: 1.0000 [drp] | Freq: Four times a day (QID) | OPHTHALMIC | 0 refills | Status: DC

## 2021-04-02 NOTE — ED Triage Notes (Signed)
Pt states that his left eye is red, itchy, and has so me drainage. X1 day

## 2021-04-02 NOTE — Discharge Instructions (Addendum)
Advised/inform patient that COVID-19 was negative this evening.  Advised patient to instill eyedrops as directed.  Advised patient if tomorrow morning when waking in the morning and if right eye has similar symptoms of left eye begin to treat right eye as well.

## 2021-04-02 NOTE — ED Triage Notes (Signed)
Pt states that he has also ran a fever over night.

## 2021-04-02 NOTE — ED Provider Notes (Signed)
[facility]    CSN: [id number] Arrival date & time: 04/02/21  1551      History   Chief Complaint Chief Complaint  Patient presents with   Eye Problem    Left eye is red, itching and has some drainage. X1 day    HPI Cody Medina is a 51 y.o. male.   HPI 51 year old male presents with red, itchy, some drainage from left eye for 1 day.  PMH significant for CAD and unstable angina.  Past Medical History:  Diagnosis Date   ADHD (attention deficit hyperactivity disorder)    antidepressants are for ADHD   Chest pain    COVID    Depression    Gout    Hyperlipidemia    Hypertension    Leukopenia    Low HDL (under 40)    Symptomatic cholelithiasis 03/2012   Thalassemia minor     Patient Active Problem List   Diagnosis Date Noted   Coronary artery disease involving native coronary artery of native heart with angina pectoris (HCC) 12/13/2015   Unstable angina pectoris (HCC) 11/14/2015   Hyperlipidemia 11/01/2015   Pre-procedural examination 11/01/2015   Symptomatic cholelithiasis 03/31/2012   OTHER DISEASES OF VOCAL CORDS 11/22/2008   ALLERGIC RHINITIS 10/13/2008   FEVER UNSPECIFIED 10/13/2008   COUGH 10/13/2008   CHEST PAIN 10/13/2008   DEPRESSION 10/12/2008   Essential hypertension 10/12/2008   G E R D 10/12/2008    Past Surgical History:  Procedure Laterality Date   CARDIAC CATHETERIZATION  11/14/2008   CARDIAC CATHETERIZATION N/A 11/15/2015   Procedure: Left Heart Cath and Coronary Angiography;  Surgeon: Tonny Bollman, MD;  Location: [facility] INVASIVE CV LAB;  Service: Cardiovascular;  Laterality: N/A;   CHOLECYSTECTOMY  04/15/2012   Procedure: LAPAROSCOPIC CHOLECYSTECTOMY;  Surgeon: Shelly Rubenstein, MD;  Location: [facility];  Service: General;  Laterality: N/A;   LARYNGOSCOPY  11/22/2008   MANDIBLE RECONSTRUCTION  1992       Home Medications    Prior to Admission medications   Medication Sig Start Date End Date Taking?  Authorizing Provider  sulfacetamide (BLEPH-10) 10 % ophthalmic solution Place 1-2 drops into the left eye 4 (four) times daily for 7 days. 04/02/21 04/09/21 Yes Trevor Iha, FNP  amphetamine-dextroamphetamine (ADDERALL) 20 MG tablet Take 20 mg by mouth daily.    [provider]  aspirin 81 MG tablet Take 81 mg by mouth daily.    [provider]  clonazePAM (KLONOPIN) 1 MG tablet Take 1 mg by mouth 2 (two) times daily.  01/28/12   [provider]  DULoxetine (CYMBALTA) 60 MG capsule Take 60 mg by mouth daily.    [provider]  lisinopril (PRINIVIL,ZESTRIL) 10 MG tablet Take 1 tablet (10 mg total) by mouth daily. 12/13/15 03/12/16  Chrystie Nose, MD  lisinopril (ZESTRIL) 10 MG tablet Take 1 tablet (10 mg total) by mouth daily. 01/11/20 02/10/20  Maxwell Caul, PA-C  meclizine (ANTIVERT) 25 MG tablet Take 1 tablet (25 mg total) by mouth 3 (three) times daily as needed for dizziness. 01/11/20   Maxwell Caul, PA-C  naproxen sodium (ALEVE) 220 MG tablet Take 220 mg by mouth 2 (two) times daily as needed (pain).     [provider]  zaleplon (SONATA) 10 MG capsule Take 10 mg by mouth at bedtime.    [provider]    Family History Family History  Problem Relation Age of Onset   Osteoporosis Mother  Hypertension Mother    Post-traumatic stress disorder Father    Prostate cancer Father    Lung cancer Maternal Aunt    Atrial fibrillation Maternal Grandmother    Lung cancer Maternal Grandmother    Non-Hodgkin's lymphoma Maternal Grandmother    Non-Hodgkin's lymphoma Maternal Grandfather    Cancer Paternal Aunt    Heart failure Paternal Grandfather    Heart attack Paternal Grandfather    Tuberculosis Paternal Grandmother     Social History Social History   Tobacco Use   Smoking status: Former    Packs/day: 1.00    Types: Cigarettes    Quit date: 03/25/1993    Years since quitting: 28.0   Smokeless tobacco: Never   Substance Use Topics   Alcohol use: No   Drug use: No     Allergies   Morphine sulfate   Review of Systems Review of Systems  Constitutional:  Positive for fever.  Eyes:  Positive for discharge and redness.  All other systems reviewed and are negative.   Physical Exam Triage Vital Signs ED Triage Vitals  Enc Vitals Group     BP 04/02/21 1628 (!) 147/96     Pulse Rate 04/02/21 1628 (!) 105     Resp 04/02/21 1628 18     Temp 04/02/21 1628 99.9 F (37.7 C)     Temp Source 04/02/21 1628 Oral     SpO2 04/02/21 1628 98 %     Weight 04/02/21 1624 245 lb (111.1 kg)     Height 04/02/21 1624 5\' 10"  (1.778 m)     Head Circumference --      Peak Flow --      Pain Score 04/02/21 1623 0     Pain Loc --      Pain Edu? --      Excl. in [place]? --    No data found.  Updated Vital Signs BP (!) 147/96 (BP Location: Right Arm)    Pulse (!) 105    Temp 99.9 F (37.7 C) (Oral)    Resp 18    Ht 5\' 10"  (1.778 m)    Wt 245 lb (111.1 kg)    SpO2 98%    BMI 35.15 kg/m       Physical Exam Vitals and nursing note reviewed.  Constitutional:      General: He is not in acute distress.    Appearance: Normal appearance. He is obese. He is not ill-appearing.  HENT:     Right Ear: Tympanic membrane, ear canal and external ear normal.     Left Ear: Tympanic membrane, ear canal and external ear normal.     Mouth/Throat:     Mouth: Mucous membranes are moist.     Pharynx: Oropharynx is clear.  Eyes:     Extraocular Movements: Extraocular movements intact.     Conjunctiva/sclera: Conjunctivae normal.     Pupils: Pupils are equal, round, and reactive to light.     Comments: Eyes: Left conjunctive/sclera with +3 injection, moderate mucopurulent discharge noted at inner/outer canthus  Cardiovascular:     Rate and Rhythm: Normal rate and regular rhythm.     Pulses: Normal pulses.     Heart sounds: Normal heart sounds.  Pulmonary:     Effort: Pulmonary effort is normal.     Breath sounds: Normal  breath sounds.  Musculoskeletal:     Cervical back: Normal range of motion and neck supple.  Skin:    General: Skin is warm and dry.  Neurological:  General: No focal deficit present.     Mental Status: He is alert and oriented to person, place, and time.     UC Treatments / Results  Labs (all labs ordered are listed, but only abnormal results are displayed) Labs Reviewed  POC SARS CORONAVIRUS 2 AG -  ED - Normal    EKG   Radiology No results found.  Procedures Procedures (including critical care time)  Medications Ordered in UC Medications - No data to display  Initial Impression / Assessment and Plan / UC Course  I have reviewed the triage vital signs and the nursing notes.  Pertinent labs & imaging results that were available during my care of the patient were reviewed by me and considered in my medical decision making (see chart for details).     MDM: 1.  Conjunctivitis of left eye, unspecified conjunctivitis type-Rx'd Sulfacetamide advised/inform patient that COVID-19 was negative this evening.  Advised patient to instill eyedrops as directed.  Advised patient if tomorrow morning when waking in the morning and if right eye has similar symptoms of left eye begin to treat right eye as well.  Patient discharged home, hemodynamically stable. Final Clinical Impressions(s) / UC Diagnoses   Final diagnoses:  Conjunctivitis of left eye, unspecified conjunctivitis type     Discharge Instructions      Advised/inform patient that COVID-19 was negative this evening.  Advised patient to instill eyedrops as directed.  Advised patient if tomorrow morning when waking in the morning and if right eye has similar symptoms of left eye begin to treat right eye as well.     ED Prescriptions     Medication Sig Dispense Auth. Provider   sulfacetamide (BLEPH-10) 10 % ophthalmic solution Place 1-2 drops into the left eye 4 (four) times daily for 7 days. 5 mL Trevor Iha, FNP       PDMP not reviewed this encounter.   Trevor Iha, FNP 04/02/21 1801

## 2021-04-03 ENCOUNTER — Telehealth: Payer: Self-pay | Admitting: Family Medicine

## 2021-04-03 MED ORDER — MOXIFLOXACIN HCL 0.5 % OP SOLN: 1.0000 [drp] | Freq: Three times a day (TID) | OPHTHALMIC | 0 refills | Status: DC

## 2021-04-03 MED ORDER — PREDNISONE 20 MG PO TABS: ORAL_TABLET | ORAL | 0 refills | Status: AC

## 2021-04-03 NOTE — Telephone Encounter (Signed)
Patient was evaluated by me for conjunctivitis yesterday Monday, 04/02/2021 and was prescribed sulfacetamide ophthalmic; unfortunately 2 pharmacies not have this medication in stock.  Patient presents this morning Tuesday, 04/03/2021 and will be given the following hardcopy prescriptions.

## 2021-04-04 ENCOUNTER — Telehealth: Payer: Self-pay | Admitting: Emergency Medicine

## 2021-04-04 MED ORDER — MOXIFLOXACIN HCL 0.5 % OP SOLN: 1.0000 [drp] | Freq: Three times a day (TID) | OPHTHALMIC | 0 refills | Status: DC

## 2021-04-04 MED ORDER — MOXIFLOXACIN HCL 0.5 % OP SOLN: 1.0000 [drp] | Freq: Three times a day (TID) | OPHTHALMIC | 0 refills | Status: AC

## 2021-04-04 NOTE — Telephone Encounter (Signed)
Refill of med printed out instead of e-script - resent by RN

## 2021-04-04 NOTE — Telephone Encounter (Signed)
Call from [place] who stated he started the medicine last night & his cats got  into the medicine last night an destroyed it or ate it. Chart reviewed w/ Dr Delton See. Ok to send in a refill. Pt aware insurance may not pay for refill.

## 2021-04-06 ENCOUNTER — Other Ambulatory Visit: Payer: Self-pay

## 2021-04-06 ENCOUNTER — Emergency Department (INDEPENDENT_AMBULATORY_CARE_PROVIDER_SITE_OTHER)
Admission: EM | Admit: 2021-04-06 | Discharge: 2021-04-06 | Disposition: A | Discharge status: Home / Self Care | Payer: 59 | Source: Home / Self Care

## 2021-04-06 ENCOUNTER — Encounter: Payer: Self-pay | Admitting: Family Medicine

## 2021-04-06 DIAGNOSIS — I10 Essential (primary) hypertension: Secondary | ICD-10-CM | POA: Diagnosis not present

## 2021-04-06 DIAGNOSIS — R519 Headache, unspecified: Secondary | ICD-10-CM | POA: Diagnosis not present

## 2021-04-06 NOTE — Discharge Instructions (Addendum)
Advised/instructed patient to go to [facility] ED now for further evaluation of elevated blood pressure and headache.

## 2021-04-06 NOTE — ED Triage Notes (Addendum)
Hypertension x 2 days Epistaxis x 2 days Has PCP Appt on 2/27 Unvaccinated

## 2021-04-06 NOTE — ED Notes (Signed)
Patient is being discharged from the Urgent Care and sent to the Emergency Department via POV w/ girlfriend. Per M. Ragan, FNP, patient is in need of higher level of care due to elevated BP and right sided HA x 2 days. Patient is aware and verbalizes understanding of plan of care.  Vitals:   04/06/21 1806  BP: (!) 167/110  Pulse: 94  Resp: 20  Temp: 98.6 F (37 C)  SpO2: 98%   Pt to [facility] ED. Pt discharged by Hervey Ard, CMA

## 2021-04-06 NOTE — ED Provider Notes (Signed)
[facility]    CSN: [id number] Arrival date & time: 04/06/21  1749      History   Chief Complaint Chief Complaint  Patient presents with   Hypertension    HPI Cody Medina is a 51 y.o. male.   HPI 51 year old male presents with nosebleeds and concern of elevated blood pressure 2 days.  He reports right sided temporal headache for the past 2 days as well.  PMH significant for HTN, CAD, and unstable angina.  167/110 is initial blood pressure here initially, patient reports 170/104 at home prior to this visit.  Past Medical History:  Diagnosis Date   ADHD (attention deficit hyperactivity disorder)    antidepressants are for ADHD   Chest pain    COVID    Depression    Gout    Hyperlipidemia    Hypertension    Leukopenia    Low HDL (under 40)    Symptomatic cholelithiasis 03/2012   Thalassemia minor     Patient Active Problem List   Diagnosis Date Noted   Coronary artery disease involving native coronary artery of native heart with angina pectoris ([place]) 12/13/2015   Unstable angina pectoris ([place]) 11/14/2015   Hyperlipidemia 11/01/2015   Pre-procedural examination 11/01/2015   Symptomatic cholelithiasis 03/31/2012   OTHER DISEASES OF VOCAL CORDS 11/22/2008   ALLERGIC RHINITIS 10/13/2008   FEVER UNSPECIFIED 10/13/2008   COUGH 10/13/2008   CHEST PAIN 10/13/2008   DEPRESSION 10/12/2008   Essential hypertension 10/12/2008   G E R D 10/12/2008    Past Surgical History:  Procedure Laterality Date   CARDIAC CATHETERIZATION  11/14/2008   CARDIAC CATHETERIZATION N/A 11/15/2015   Procedure: Left Heart Cath and Coronary Angiography;  Surgeon: Sherren Mocha, MD;  Location: [facility];  Service: Cardiovascular;  Laterality: N/A;   CHOLECYSTECTOMY  04/15/2012   Procedure: LAPAROSCOPIC CHOLECYSTECTOMY;  Surgeon: Harl Bowie, MD;  Location: [facility];  Service: General;  Laterality: N/A;   LARYNGOSCOPY  11/22/2008   MANDIBLE  RECONSTRUCTION  1992       Home Medications    Prior to Admission medications   Medication Sig Start Date End Date Taking? Authorizing Provider  amphetamine-dextroamphetamine (ADDERALL) 20 MG tablet Take 20 mg by mouth daily.    [provider]  aspirin 81 MG tablet Take 81 mg by mouth daily.    [provider]  clonazePAM (KLONOPIN) 1 MG tablet Take 1 mg by mouth 2 (two) times daily.  01/28/12   [provider]  DULoxetine (CYMBALTA) 60 MG capsule Take 60 mg by mouth daily.    [provider]  lisinopril (PRINIVIL,ZESTRIL) 10 MG tablet Take 1 tablet (10 mg total) by mouth daily. 12/13/15 03/12/16  Pixie Casino, MD  lisinopril (ZESTRIL) 10 MG tablet Take 1 tablet (10 mg total) by mouth daily. 01/11/20 02/10/20  Volanda Napoleon, PA-C  meclizine (ANTIVERT) 25 MG tablet Take 1 tablet (25 mg total) by mouth 3 (three) times daily as needed for dizziness. 01/11/20   Volanda Napoleon, PA-C  moxifloxacin (VIGAMOX) 0.5 % ophthalmic solution Place 1 drop into the left eye 3 (three) times daily for 7 days. 04/04/21 04/11/21  Eliezer Lofts, FNP  naproxen sodium (ALEVE) 220 MG tablet Take 220 mg by mouth 2 (two) times daily as needed (pain).     [provider]  predniSONE (DELTASONE) 20 MG tablet Take 3 tabs PO daily x 5 days. 04/03/21   Eliezer Lofts, FNP  zaleplon [employer])  10 MG capsule Take 10 mg by mouth at bedtime.    [provider]    Family History Family History  Problem Relation Age of Onset   Osteoporosis Mother    Hypertension Mother    Post-traumatic stress disorder Father    Prostate cancer Father    Lung cancer Maternal Aunt    Atrial fibrillation Maternal Grandmother    Lung cancer Maternal Grandmother    Non-Hodgkin's lymphoma Maternal Grandmother    Non-Hodgkin's lymphoma Maternal Grandfather    Cancer Paternal Aunt    Heart failure Paternal Grandfather    Heart attack Paternal Grandfather    Tuberculosis Paternal  Grandmother     Social History Social History   Tobacco Use   Smoking status: Former    Packs/day: 1.00    Types: Cigarettes    Quit date: 03/25/1993    Years since quitting: 28.0   Smokeless tobacco: Never  Vaping Use   Vaping Use: Never used  Substance Use Topics   Alcohol use: No   Drug use: No     Allergies   Morphine sulfate   Review of Systems Review of Systems  HENT:  Positive for nosebleeds.     Physical Exam Triage Vital Signs ED Triage Vitals  Enc Vitals Group     BP      Pulse      Resp      Temp      Temp src      SpO2      Weight      Height      Head Circumference      Peak Flow      Pain Score      Pain Loc      Pain Edu?      Excl. in [place]?    No data found.  Updated Vital Signs BP (!) 167/110 (BP Location: Left Arm)    Pulse 94    Temp 98.6 F (37 C) (Oral)    Resp 20    Ht 5\' 10"  (1.778 m)    Wt 240 lb (108.9 kg)    SpO2 98%    BMI 34.44 kg/m    Physical Exam Vitals and nursing note reviewed.  Constitutional:      General: He is not in acute distress.    Appearance: Normal appearance. He is obese. He is not ill-appearing.  HENT:     Head: Normocephalic and atraumatic.     Nose: Nose normal.     Comments: Right nostril: Turbinate is erythematous, no bleeding noted; Left nostril: patent, nonerythematous/nonedematous    Mouth/Throat:     Mouth: Mucous membranes are moist.     Pharynx: Oropharynx is clear.  Eyes:     Extraocular Movements: Extraocular movements intact.     Conjunctiva/sclera: Conjunctivae normal.     Pupils: Pupils are equal, round, and reactive to light.  Neck:     Comments: No JVD, no bruit Cardiovascular:     Rate and Rhythm: Normal rate and regular rhythm.     Pulses: Normal pulses.     Heart sounds: Normal heart sounds.     Comments: Hypertensive Pulmonary:     Effort: Pulmonary effort is normal.     Breath sounds: Normal breath sounds.  Musculoskeletal:     Cervical back: Normal range of motion and  neck supple.  Skin:    General: Skin is warm and dry.  Neurological:     General: No focal deficit present.  Mental Status: He is alert and oriented to person, place, and time.     UC Treatments / Results  Labs (all labs ordered are listed, but only abnormal results are displayed) Labs Reviewed - No data to display  EKG   Radiology No results found.  Procedures Procedures (including critical care time)  Medications Ordered in UC Medications - No data to display  Initial Impression / Assessment and Plan / UC Course  I have reviewed the triage vital signs and the nursing notes.  Pertinent labs & imaging results that were available during my care of the patient were reviewed by me and considered in my medical decision making (see chart for details).    MDM: 1.  Hypertension-third manual blood pressure seated left arm 164/102-advised patient to go to [facility] ED NOW for further evaluation of elevated blood pressure and headache.  Patient agreed and verbalized understanding of these instructions and this plan of care today.  Patient's girlfriend who drove him is outside and will drive him to local ED now. Final Clinical Impressions(s) / UC Diagnoses   Final diagnoses:  Hypertension, unspecified type  Headache disorder     Discharge Instructions      Advised/instructed patient to go to [facility] ED now for further evaluation of elevated blood pressure and headache.     ED Prescriptions   None    PDMP not reviewed this encounter.   Eliezer Lofts, [place] 04/06/21 1825

## 2021-11-02 ENCOUNTER — Ambulatory Visit: Payer: 59 | Admitting: Cardiovascular Disease

## 2023-01-20 ENCOUNTER — Encounter: Payer: Self-pay | Admitting: Family Medicine

## 2023-01-20 ENCOUNTER — Other Ambulatory Visit (HOSPITAL_COMMUNITY): Payer: Self-pay | Admitting: Family Medicine

## 2023-01-20 DIAGNOSIS — E890 Postprocedural hypothyroidism: Secondary | ICD-10-CM

## 2023-01-22 ENCOUNTER — Other Ambulatory Visit (HOSPITAL_COMMUNITY): Payer: Self-pay | Admitting: Family Medicine

## 2023-01-22 DIAGNOSIS — Z0189 Encounter for other specified special examinations: Secondary | ICD-10-CM
